# Patient Record
Sex: Male | Born: 1985 | Race: White | Hispanic: No | Marital: Single | Smoking: Former smoker
Health system: Southern US, Community
[De-identification: ages and names within clinical notes are randomized; demographics above are authoritative.]

## PROBLEM LIST (undated history)

## (undated) DIAGNOSIS — I4891 Unspecified atrial fibrillation: Secondary | ICD-10-CM

## (undated) HISTORY — PX: HIP SURGERY: SHX245

---
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
## 2003-01-01 ENCOUNTER — Encounter: Payer: Self-pay | Admitting: Emergency Medicine

## 2003-08-26 ENCOUNTER — Emergency Department (HOSPITAL_COMMUNITY): Admission: EM | Admit: 2003-08-26 | Discharge: 2003-08-26 | Payer: Self-pay | Admitting: Emergency Medicine

## 2004-03-16 ENCOUNTER — Emergency Department (HOSPITAL_COMMUNITY): Admission: EM | Admit: 2004-03-16 | Discharge: 2004-03-16 | Payer: Self-pay | Admitting: Emergency Medicine

## 2004-03-29 ENCOUNTER — Ambulatory Visit: Payer: Self-pay | Admitting: Gastroenterology

## 2004-06-27 ENCOUNTER — Ambulatory Visit: Payer: Self-pay | Admitting: Internal Medicine

## 2004-08-09 ENCOUNTER — Ambulatory Visit: Payer: Self-pay | Admitting: Internal Medicine

## 2004-08-10 ENCOUNTER — Emergency Department (HOSPITAL_COMMUNITY): Admission: EM | Admit: 2004-08-10 | Discharge: 2004-08-10 | Payer: Self-pay | Admitting: Family Medicine

## 2006-12-29 ENCOUNTER — Encounter (INDEPENDENT_AMBULATORY_CARE_PROVIDER_SITE_OTHER): Payer: Self-pay | Admitting: Emergency Medicine

## 2006-12-29 ENCOUNTER — Ambulatory Visit: Admission: RE | Admit: 2006-12-29 | Discharge: 2006-12-29 | Payer: Self-pay | Admitting: Emergency Medicine

## 2008-11-02 ENCOUNTER — Encounter: Admission: RE | Admit: 2008-11-02 | Discharge: 2008-11-02 | Payer: Self-pay | Admitting: Specialist

## 2011-10-20 ENCOUNTER — Ambulatory Visit (INDEPENDENT_AMBULATORY_CARE_PROVIDER_SITE_OTHER): Payer: BC Managed Care – PPO | Admitting: Family Medicine

## 2011-10-20 VITALS — BP 130/86 | HR 84 | Temp 98.1°F | Resp 18 | Ht 70.5 in | Wt 250.0 lb

## 2011-10-20 DIAGNOSIS — R21 Rash and other nonspecific skin eruption: Secondary | ICD-10-CM

## 2011-10-20 MED ORDER — IVERMECTIN 3 MG PO TABS: 3.0000 mg | ORAL_TABLET | Freq: Once | ORAL | Status: DC

## 2011-10-20 NOTE — Progress Notes (Signed)
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  He woke up with red itchy area under left axilla.  Has had exterminator come twice.  No sign of fleas or bedbugs.  Dog sleeps in bed with him.  Girlfriend also is getting this rash.   Last episode before today was last week.  O:  NAD Bright red streak in left axilla  A:  Appears to have some type of infestation.  I cannot identify the source, but this is not typical for bedbug or scabies infestation  P:  Ivermectin 3 mg #4 x 1

## 2012-04-05 ENCOUNTER — Ambulatory Visit (INDEPENDENT_AMBULATORY_CARE_PROVIDER_SITE_OTHER): Payer: BC Managed Care – PPO | Admitting: Family Medicine

## 2012-04-05 VITALS — BP 122/90 | HR 72 | Temp 98.0°F | Resp 16 | Ht 69.0 in | Wt 244.0 lb

## 2012-04-05 DIAGNOSIS — B349 Viral infection, unspecified: Secondary | ICD-10-CM

## 2012-04-05 DIAGNOSIS — B9789 Other viral agents as the cause of diseases classified elsewhere: Secondary | ICD-10-CM

## 2012-04-05 LAB — POCT INFLUENZA A/B
Influenza A, POC: NEGATIVE
Influenza B, POC: NEGATIVE

## 2012-04-05 MED ORDER — HYDROCODONE-HOMATROPINE 5-1.5 MG/5ML PO SYRP: 5.0000 mL | ORAL_SOLUTION | Freq: Three times a day (TID) | ORAL | Status: DC | PRN

## 2012-04-05 NOTE — Progress Notes (Signed)
  Subjective:    Patient ID: Jonathan Russell, male    DOB: 10/19/1985, 26 y.o.   MRN: [id number]  HPI  He got sick a day after his girlfriend.  Has subj f/c at night, pharyngitis initially resolved, burning in chest and coughing but can't get anything up though taking mucinex.  Is pushing fluid but able to drink much - feelng nausea initially but now resolved, no vomiting, normal bowels and nml urine.   Back aches.  History reviewed. No pertinent past medical history.  Review of Systems  Constitutional: Positive for fever, chills, diaphoresis, activity change, appetite change and fatigue. Negative for unexpected weight change.  HENT: Positive for congestion, sore throat and neck pain. Negative for ear pain, sneezing, trouble swallowing, neck stiffness, voice change and sinus pressure.   Eyes: Negative for discharge.  Respiratory: Positive for cough. Negative for chest tightness and shortness of breath.   Cardiovascular: Positive for chest pain.  Gastrointestinal: Positive for nausea. Negative for vomiting, abdominal pain, diarrhea and constipation.  Genitourinary: Negative for dysuria, frequency and decreased urine volume.  Musculoskeletal: Positive for myalgias, back pain and arthralgias. Negative for joint swelling and gait problem.  Skin: Negative for rash.  Neurological: Positive for headaches. Negative for syncope.  Hematological: Negative for adenopathy.  Psychiatric/Behavioral: Positive for sleep disturbance.      BP 122/90  Pulse 72  Temp 98 F (36.7 C) (Oral)  Resp 16  Ht 5\' 9"  (1.753 m)  Wt 244 lb (110.678 kg)  BMI 36.03 kg/m2  SpO2 99% Objective:   Physical Exam  Constitutional: He is oriented to person, place, and time. He appears well-developed and well-nourished. No distress.  HENT:  Head: Normocephalic and atraumatic.  Right Ear: Tympanic membrane, external ear and ear canal normal.  Left Ear: Tympanic membrane, external ear and ear canal normal.  Nose:  Rhinorrhea present. No mucosal edema.  Mouth/Throat: Oropharynx is clear and moist and mucous membranes are normal. No oropharyngeal exudate.  Eyes: Conjunctivae normal are normal. No scleral icterus.  Neck: Normal range of motion. Neck supple. No thyromegaly present.  Cardiovascular: Normal rate, regular rhythm, normal heart sounds and intact distal pulses.   Pulmonary/Chest: Effort normal and breath sounds normal. No respiratory distress.  Musculoskeletal: He exhibits no edema.  Lymphadenopathy:       Head (right side): No submental, no submandibular, no preauricular and no posterior auricular adenopathy present.       Head (left side): No submental, no submandibular, no preauricular and no posterior auricular adenopathy present.    He has cervical adenopathy.       Right cervical: Superficial cervical adenopathy present.       Left cervical: Superficial cervical adenopathy present.       Right: No supraclavicular adenopathy present.       Left: No supraclavicular adenopathy present.  Neurological: He is alert and oriented to person, place, and time.  Skin: Skin is warm and dry. He is not diaphoretic. No erythema.  Psychiatric: He has a normal mood and affect. His behavior is normal.      Results for orders placed in visit on 04/05/12  POCT INFLUENZA A/B      Component Value Range   Influenza A, POC Negative     Influenza B, POC Negative      Assessment & Plan:   1. Viral syndrome  HYDROcodone-homatropine (HYCODAN) 5-1.5 MG/5ML syrup, POCT Influenza A/B  Rest, push fluids, symptomatic treatment for bronchitis.

## 2012-04-07 ENCOUNTER — Telehealth: Payer: Self-pay

## 2012-04-07 NOTE — Telephone Encounter (Signed)
Diagnosis is virus, antibiotic will not help this. Does he need work note? I have called him to advise. He is taking mucinex ,I told him to continue this. He is advised also to try sudafed, to see if this is helpful. He states he already has work note. FYI only

## 2012-04-07 NOTE — Telephone Encounter (Signed)
[facility]ANT[facility] TO CALL HIM IN AN ANTIBIOTIC.  SAYS IT IS NOT GETTING ANY BETTER AT ALL.  DOES NOT HAVE THE MONEY FOR ANOTHER OFFICE VISIT.  SAYS HE HAS MISSED WORK ALL WEEK.  HE USES [facility] ON [address].  PLEASE CALL HIM ASAP AND LET HIM KNOW IF WE CAN CALL IN SOMETHING.  [phone]

## 2012-04-09 NOTE — Telephone Encounter (Signed)
Called pt. He is feeling better. Just a lingering cough but cough syrup helping and he doesn't need refill.  His girlfriend is also better.

## 2012-04-13 ENCOUNTER — Ambulatory Visit (INDEPENDENT_AMBULATORY_CARE_PROVIDER_SITE_OTHER): Payer: BC Managed Care – PPO | Admitting: Family Medicine

## 2012-04-13 ENCOUNTER — Ambulatory Visit: Payer: BC Managed Care – PPO

## 2012-04-13 VITALS — BP 124/79 | HR 86 | Temp 98.3°F | Resp 16 | Ht 69.0 in | Wt 247.8 lb

## 2012-04-13 DIAGNOSIS — S61209A Unspecified open wound of unspecified finger without damage to nail, initial encounter: Secondary | ICD-10-CM

## 2012-04-13 DIAGNOSIS — Z23 Encounter for immunization: Secondary | ICD-10-CM

## 2012-04-13 DIAGNOSIS — M79609 Pain in unspecified limb: Secondary | ICD-10-CM

## 2012-04-13 DIAGNOSIS — M79645 Pain in left finger(s): Secondary | ICD-10-CM

## 2012-04-13 DIAGNOSIS — S62639B Displaced fracture of distal phalanx of unspecified finger, initial encounter for open fracture: Secondary | ICD-10-CM

## 2012-04-13 DIAGNOSIS — S6710XA Crushing injury of unspecified finger(s), initial encounter: Secondary | ICD-10-CM

## 2012-04-13 DIAGNOSIS — S62637B Displaced fracture of distal phalanx of left little finger, initial encounter for open fracture: Secondary | ICD-10-CM

## 2012-04-13 MED ORDER — KETOROLAC TROMETHAMINE 60 MG/2ML IM SOLN
60.0000 mg | Freq: Once | INTRAMUSCULAR | Status: AC
  Administered 2012-04-13: 60 mg via INTRAMUSCULAR

## 2012-04-13 MED ORDER — TRAMADOL HCL 50 MG PO TABS: 50.0000 mg | ORAL_TABLET | Freq: Three times a day (TID) | ORAL | Status: DC | PRN

## 2012-04-13 MED ORDER — TETANUS-DIPHTH-ACELL PERTUSSIS 5-2.5-18.5 LF-MCG/0.5 IM SUSP
0.5000 mL | Freq: Once | INTRAMUSCULAR | Status: AC
  Administered 2012-04-13: 0.5 mL via INTRAMUSCULAR

## 2012-04-13 NOTE — Progress Notes (Signed)
26 year old gentleman who crushed his left pinky finger while working on his car this morning. He crushed the distal phalanx and partially avulsed the nail. Finger is quite painful and has been oozing blood. His last technical shot was in 2006.  Objective: Patient alert and cooperative holding his left hand still Good sensation and ROM demonstrated by patient before anesth.  Digital block with 2 cc marcaine 0.5% each side of proximal phalanx  [facility] reading (PRIMARY) by  Dr. Milus Glazier:  Comminuted distal phalanx L pinky fx sparing the joint.  The distal phalanx of the pinky has been lacerated from the middle distal nail across the top of the phalanx and at the ulnar base of the cuticle where the proximal nail appears to have been avulsed.  The bleeding has subsided.  Assessment:    1. Finger pain, left  DG Finger Little Left  2. Wound, open, finger  DG Finger Little Left, TDaP (BOOSTRIX) injection 0.5 mL

## 2012-04-13 NOTE — Patient Instructions (Addendum)
Keep area clean and dry.  Recheck in 48 hrs for drsg removal and recheck.  Take pain meds as prescribed.  Call if you need a stronger pain med.  We will probably take out sutures in 10d.  WOUND CARE Please return in 2 days to have your wound checked.  The stitches/staples will be removed in about 10d. Marland Kitchen Keep area clean and dry for 24 hours. Do not remove bandage, if applied. . After 24 hours, remove bandage and wash wound gently with mild soap and warm water. Reapply a new bandage after cleaning wound, if directed. . Continue daily cleansing with soap and water until stitches/staples are removed. . Do not apply any ointments or creams to the wound while stitches/staples are in place, as this may cause delayed healing. . Notify the office if you experience any of the following signs of infection: Swelling, redness, pus drainage, streaking, fever >101.0 F . Notify the office if you experience excessive bleeding that does not stop after 15-20 minutes of constant, firm pressure.

## 2012-04-13 NOTE — Progress Notes (Signed)
Procedure:  Consent obtained from patient.  MC block with 1% lido and marcaine (50:50) with successful anesthesia.  Wound explored and cleaned.  Fingertip laceration repair with 5-0 ethilon #11 SI sutures.  Once finger tip was stable, avulsed nail was removed.  Nail bed was 100% lacerated about at distal 1/3 of nail.  Bone felt and visualized because part of nail bed was on removed nail.  5-0 Vicryl was used # 3 SI sutures.  Nail placed back on nail bed for splint.  Steri-strip placed around nail to hold it in place.  Non stick drsg and fold over splint was placed.  Wound care D/w pt.  Pt to recheck in 48h, do not removed the drsg.  D/w pt he is able to take hydrocodone without nausea, he gets a little light headed.

## 2012-04-15 ENCOUNTER — Ambulatory Visit (INDEPENDENT_AMBULATORY_CARE_PROVIDER_SITE_OTHER): Payer: BC Managed Care – PPO | Admitting: Family Medicine

## 2012-04-15 ENCOUNTER — Encounter: Payer: Self-pay | Admitting: Family Medicine

## 2012-04-15 VITALS — BP 134/85 | HR 81 | Temp 97.8°F | Resp 16 | Ht 69.0 in | Wt 249.0 lb

## 2012-04-15 DIAGNOSIS — S61209A Unspecified open wound of unspecified finger without damage to nail, initial encounter: Secondary | ICD-10-CM

## 2012-04-15 DIAGNOSIS — M79609 Pain in unspecified limb: Secondary | ICD-10-CM

## 2012-04-15 DIAGNOSIS — S62639A Displaced fracture of distal phalanx of unspecified finger, initial encounter for closed fracture: Secondary | ICD-10-CM

## 2012-04-15 NOTE — Progress Notes (Signed)
Is a 26 year old man who comes in with painful left pinky after having had a crush injury on Tuesday, December 24. His work on his car when the finger was crushed. He was diagnosed with a fracture laceration. Finger was repaired by removing the nail and then replacing it over the matrix uses a splint. In addition he receives and stitches to close the distal phalanx laceration.  Since the left, patient has had uncontrolled pain. It did seem to get better when he took off the splint because the pressure was so unbearable. He's had no fever or discharge from the dressing.  Patient is out of work until Monday  Objective: We remove the tube dressing and Coban revealing a Steri-Strip dressing. The finger was not sufficiently swollen and there was no redness under the bandage that  Remained.  Assessment: No sign of acute infection wound is dry and clean sew the inner bandage was not removed  Plan:  Recheck in 3 days.  Continue monitoring of situation for 3 weeks

## 2012-04-18 ENCOUNTER — Ambulatory Visit (INDEPENDENT_AMBULATORY_CARE_PROVIDER_SITE_OTHER): Payer: BC Managed Care – PPO | Admitting: Physician Assistant

## 2012-04-18 VITALS — BP 131/84 | HR 94 | Temp 98.4°F | Resp 16 | Ht 69.0 in | Wt 252.0 lb

## 2012-04-18 DIAGNOSIS — M79646 Pain in unspecified finger(s): Secondary | ICD-10-CM

## 2012-04-18 DIAGNOSIS — S61209A Unspecified open wound of unspecified finger without damage to nail, initial encounter: Secondary | ICD-10-CM

## 2012-04-18 DIAGNOSIS — M79609 Pain in unspecified limb: Secondary | ICD-10-CM
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
## 2012-04-21 ENCOUNTER — Ambulatory Visit (INDEPENDENT_AMBULATORY_CARE_PROVIDER_SITE_OTHER): Payer: BC Managed Care – PPO | Admitting: Physician Assistant

## 2012-04-21 VITALS — BP 126/78 | HR 80 | Temp 97.9°F | Resp 18 | Ht 70.0 in | Wt 253.6 lb

## 2012-04-21 DIAGNOSIS — M79609 Pain in unspecified limb: Secondary | ICD-10-CM

## 2012-04-21 DIAGNOSIS — M79646 Pain in unspecified finger(s): Secondary | ICD-10-CM

## 2012-04-21 DIAGNOSIS — S61209A Unspecified open wound of unspecified finger without damage to nail, initial encounter: Secondary | ICD-10-CM

## 2012-04-21 NOTE — Progress Notes (Signed)
  Subjective:    Patient ID: Jonathan Russell, male    DOB: 05/20/1985, 27 y.o.   MRN: [id number]  HPI   Jonathan Russell is a very pleasant 27 yr old male here for wound check/suture removal.  Previous note reviewed, he was seen here 3 days ago for recheck.  Patient is doing well. Wound is healing nicely.  No drainage, redness, swelling, fever, chills.      Review of Systems  All other systems reviewed and are negative.       Objective:   Physical Exam  Vitals reviewed. Constitutional: He is oriented to person, place, and time. He appears well-developed and well-nourished. No distress.  HENT:  Head: Normocephalic and atraumatic.  Musculoskeletal:       Healing wound on distal portion of left 5th digit; steristrips in place; no swelling, erythema, drainage; TTP  Neurological: He is alert and oriented to person, place, and time.  Skin: Skin is warm and dry.  Psychiatric: He has a normal mood and affect. His behavior is normal.          Assessment & Plan:   1. Wound, open, finger   2. Pain in finger     Jonathan Russell is a very pleasant 27 yr old male here for recheck/suture removal.  Wound is healing very well.  I removed 5 sutures today.  Several sutures are covered by the steristrips that are holding the nail in place.  Will have him return in three days to remove steristrips and remaining sutures.  Fast track card given.

## 2012-04-24 ENCOUNTER — Ambulatory Visit (INDEPENDENT_AMBULATORY_CARE_PROVIDER_SITE_OTHER): Payer: BC Managed Care – PPO | Admitting: Physician Assistant

## 2012-04-24 VITALS — BP 144/84 | HR 97 | Temp 97.4°F | Resp 16 | Ht 69.0 in | Wt 255.0 lb

## 2012-04-24 DIAGNOSIS — S61209A Unspecified open wound of unspecified finger without damage to nail, initial encounter: Secondary | ICD-10-CM

## 2012-04-24 NOTE — Progress Notes (Signed)
  Subjective:    Patient ID: Jonathan Russell, male    DOB: 1986-04-18, 27 y.o.   MRN: [id number]  HPI 27 year old male presents for recheck of left pinky wound.  Had #5 suture removed 2 days ago, and was told to come back today for the remainder to be removed.  Steri strips still in place that are holding nail down. No erythema, warmth, or drainage. Patient doing well without complaint.      Review of Systems  Constitutional: Negative for fever and chills.  Gastrointestinal: Negative for nausea and vomiting.  Skin: Positive for wound.       Objective:   Physical Exam  Constitutional: He is oriented to person, place, and time. He appears well-developed and well-nourished.  HENT:  Head: Normocephalic and atraumatic.  Right Ear: External ear normal.  Eyes: Conjunctivae normal are normal.  Neck: Normal range of motion.  Cardiovascular: Normal rate.   Pulmonary/Chest: Effort normal.  Neurological: He is alert and oriented to person, place, and time.  Skin:       Left pinky wound well healing without erythema, warmth, or tenderness. Scab in place over sutures.    Psychiatric: He has a normal mood and affect. His behavior is normal. Judgment and thought content normal.          Assessment & Plan:    1. Wound, open, finger   #6 suture removed without difficulty.  Recommend daily warm soapy soaks to help dissolve scab Follow up as needed.

## 2012-07-01 ENCOUNTER — Ambulatory Visit (INDEPENDENT_AMBULATORY_CARE_PROVIDER_SITE_OTHER): Payer: BC Managed Care – PPO | Admitting: Emergency Medicine

## 2012-07-01 VITALS — BP 122/80 | HR 77 | Temp 98.1°F | Resp 18 | Ht 69.0 in | Wt 256.0 lb

## 2012-07-01 DIAGNOSIS — S058X9A Other injuries of unspecified eye and orbit, initial encounter: Secondary | ICD-10-CM

## 2012-07-01 DIAGNOSIS — S0502XA Injury of conjunctiva and corneal abrasion without foreign body, left eye, initial encounter: Secondary | ICD-10-CM

## 2012-07-01 MED ORDER — HYDROCODONE-ACETAMINOPHEN 5-325 MG PO TABS: 1.0000 | ORAL_TABLET | ORAL | Status: DC | PRN

## 2012-07-01 MED ORDER — OFLOXACIN 0.3 % OP SOLN: 1.0000 [drp] | OPHTHALMIC | Status: DC

## 2012-07-01 NOTE — Patient Instructions (Addendum)
Corneal Abrasion  The cornea is the clear covering at the front and center of the eye. When looking at the colored portion (iris) of the eye, you are looking through that person's cornea.   This very thin tissue is made up of many layers. The surface layer is a single layer of cells called the corneal epithelium. This is one of the most sensitive tissues in the body. If a scratch or injury causes the corneal epithelium to come off, it is called a corneal abrasion. If the injury extends to the tissues below the epithelium, the condition is called a corneal ulcer.   CAUSES    Scratches.   Trauma.   Foreign body in the eye.   Some people have recurrences of abrasions in the area of the original injury even after they heal. This is called recurrent erosion syndrome. Recurrent erosion syndromes generally improve and go away with time.  SYMPTOMS    Eye pain.   Difficulty or inability to keep the injured eye open.   The eye becomes very sensitive to light.   Recurrent erosions tend to happen suddenly, first thing in the morning  usually upon awakening and opening the eyes.  DIAGNOSIS   Your eye professional can diagnose a corneal abrasion during an eye exam. Dye is usually placed in the eye using a drop or a small paper strip moistened by the patient's tears. When the eye is examined with a special light, the abrasion shows up clearly because of the dye.  TREATMENT    Small abrasions may be treated with antibiotic drops or ointment alone.   Usually a pressure patch is specially applied. Pressure patches prevent the eye from blinking, allowing the corneal epithelium to heal. Because blinking is less, a pressure patch also reduces the amount of pain present in the eye during healing. Most corneal abrasions heal within 2-3 days with no effect on vision. WARNING: Do not drive or operate machinery while your eye is patched. Your ability to judge distances is impaired.   If abrasion becomes infected and spreads to the  deeper tissues of the cornea, a corneal ulcer can result. This is serious because it can cause corneal scarring. Corneal scars interfere with light passing through the cornea, and cause a loss of vision in the involved eye.   If your caregiver has given you a follow-up appointment, it is very important to keep that appointment. Not keeping the appointment could result in a severe eye infection or permanent loss of vision. If there is any problem keeping the appointment, you must call back to this facility for assistance.  SEEK MEDICAL CARE IF:    You have pain, light sensitivity and a scratchy feeling in one eye (or both).   Your pressure patch keeps loosening up and you can blink your eye under the patch after treatment.   Any kind of discharge develops from the involved eye after treatment or if the lids stick together in the morning.   You have the same symptoms in the morning as you did with the original abrasion days, weeks or months after the abrasion healed.  MAKE SURE YOU:    Understand these instructions.   Will watch your condition.   Will get help right away if you are not doing well or get worse.  Document Released: 04/04/2000 Document Revised: 06/30/2011 Document Reviewed: 11/11/2007  [employer] Patient Information 2013 [employer].
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
## 2012-09-20 ENCOUNTER — Ambulatory Visit (INDEPENDENT_AMBULATORY_CARE_PROVIDER_SITE_OTHER): Payer: BC Managed Care – PPO | Admitting: Internal Medicine

## 2012-09-20 VITALS — BP 131/79 | HR 73 | Temp 97.9°F | Resp 16 | Ht 69.75 in | Wt 258.8 lb

## 2012-09-20 DIAGNOSIS — H5710 Ocular pain, unspecified eye: Secondary | ICD-10-CM

## 2012-09-20 DIAGNOSIS — Z6837 Body mass index (BMI) 37.0-37.9, adult: Secondary | ICD-10-CM | POA: Insufficient documentation

## 2012-09-20 DIAGNOSIS — T1500XA Foreign body in cornea, unspecified eye, initial encounter: Secondary | ICD-10-CM

## 2012-09-20 DIAGNOSIS — H5711 Ocular pain, right eye: Secondary | ICD-10-CM

## 2012-09-20 DIAGNOSIS — T1501XA Foreign body in cornea, right eye, initial encounter: Secondary | ICD-10-CM

## 2012-09-20 DIAGNOSIS — F17200 Nicotine dependence, unspecified, uncomplicated: Secondary | ICD-10-CM

## 2012-09-20 MED ORDER — GENTAMICIN SULFATE 0.3 % OP SOLN: OPHTHALMIC | Status: DC

## 2012-09-20 NOTE — Progress Notes (Signed)
  Subjective:    Patient ID: Jonathan Russell, male    DOB: 1986/02/14, 27 y.o.   MRN: [id number]  HPI  first office visit complaining of pain for 24 hours in the right eye Photophobia, discharge, redness, foreign body sensation Started after mowing grass yesterday No vision changes  Patient Active Problem List   Diagnosis Date Noted  . Nicotine addiction 09/20/2012  . BMI 37.0-37.9, adult 09/20/2012     Review of Systems     Objective:   Physical Exam BP 131/79  Pulse 73  Temp(Src) 97.9 F (36.6 C) (Oral)  Resp 16  Ht 5' 9.75" (1.772 m)  Wt 258 lb 12.8 oz (117.391 kg)  BMI 37.39 kg/m2  SpO2 99% Right eye has conjunctival injection and inability to maintain open lids No lid swelling or redness Ophthaine plus fluorescein allowed observation of a defect at 1:00 at the margin of the cornea Defect caused by foreign body which was no longer present after swabbing and irrigation Lids everted and swabbed with no other foreign bodies discovered  Pupils equal reactive to light and accommodation/EOMs conjugate       Assessment & Plan:  Problem #1 pain R eye Problem #2 corneal foreign body   OOW today Meds ordered this encounter  Medications  . gentamicin (GARAMYCIN) 0.3 % ophthalmic solution    Sig: 2 drops R eye q 2 hr today then every 4 hr for 2 more days    Dispense:  5 mL    Refill:  0   Followup in 24 hours to 48 hours if not responding  Needs followup for primary care for noted problems

## 2012-10-05 ENCOUNTER — Ambulatory Visit (INDEPENDENT_AMBULATORY_CARE_PROVIDER_SITE_OTHER): Payer: BC Managed Care – PPO | Admitting: Internal Medicine

## 2012-10-05 VITALS — BP 128/78 | HR 94 | Temp 98.1°F | Resp 18 | Ht 69.0 in | Wt 254.0 lb

## 2012-10-05 DIAGNOSIS — R21 Rash and other nonspecific skin eruption: Secondary | ICD-10-CM

## 2012-10-05 DIAGNOSIS — L0291 Cutaneous abscess, unspecified: Secondary | ICD-10-CM

## 2012-10-05 DIAGNOSIS — D72829 Elevated white blood cell count, unspecified: Secondary | ICD-10-CM

## 2012-10-05 DIAGNOSIS — L0390 Cellulitis, unspecified: Secondary | ICD-10-CM

## 2012-10-05 DIAGNOSIS — R1110 Vomiting, unspecified: Secondary | ICD-10-CM

## 2012-10-05 DIAGNOSIS — L299 Pruritus, unspecified: Secondary | ICD-10-CM

## 2012-10-05 LAB — POCT CBC
Granulocyte percent: 78.1 %G (ref 37–80)
HCT, POC: 49 % (ref 43.5–53.7)
Hemoglobin: 15.9 g/dL (ref 14.1–18.1)
Lymph, poc: 2.2 (ref 0.6–3.4)
MCHC: 32.4 g/dL (ref 31.8–35.4)
MCV: 94.2 fL (ref 80–97)
MID (cbc): 0.9 (ref 0–0.9)
POC Granulocyte: 10.9 — AB (ref 2–6.9)
Platelet Count, POC: 232 10*3/uL (ref 142–424)
RBC: 5.2 M/uL (ref 4.69–6.13)
RDW, POC: 13.1 %
WBC: 13.9 10*3/uL — AB (ref 4.6–10.2)

## 2012-10-05 MED ORDER — CEFTRIAXONE SODIUM 1 G IJ SOLR
1.0000 g | Freq: Once | INTRAMUSCULAR | Status: AC
  Administered 2012-10-05: 1 g via INTRAMUSCULAR

## 2012-10-05 MED ORDER — DOXYCYCLINE HYCLATE 100 MG PO TABS: 100.0000 mg | ORAL_TABLET | Freq: Two times a day (BID) | ORAL | Status: DC

## 2012-10-05 MED ORDER — MUPIROCIN 2 % EX OINT: TOPICAL_OINTMENT | Freq: Three times a day (TID) | CUTANEOUS | Status: DC

## 2012-10-05 NOTE — Patient Instructions (Addendum)
Cellulitis Cellulitis is an infection of the skin and the tissue beneath it. The infected area is usually red and tender. Cellulitis occurs most often in the arms and lower legs.  CAUSES  Cellulitis is caused by bacteria that enter the skin through cracks or cuts in the skin. The most common types of bacteria that cause cellulitis are Staphylococcus and Streptococcus. SYMPTOMS   Redness and warmth.  Swelling.  Tenderness or pain.  Fever. DIAGNOSIS  Your caregiver can usually determine what is wrong based on a physical exam. Blood tests may also be done. TREATMENT  Treatment usually involves taking an antibiotic medicine. HOME CARE INSTRUCTIONS   Take your antibiotics as directed. Finish them even if you start to feel better.  Keep the infected arm or leg elevated to reduce swelling.  Apply a warm cloth to the affected area up to 4 times per day to relieve pain.  Only take over-the-counter or prescription medicines for pain, discomfort, or fever as directed by your caregiver.  Keep all follow-up appointments as directed by your caregiver. SEEK MEDICAL CARE IF:   You notice red streaks coming from the infected area.  Your red area gets larger or turns dark in color.  Your bone or joint underneath the infected area becomes painful after the skin has healed.  Your infection returns in the same area or another area.  You notice a swollen bump in the infected area.  You develop new symptoms. SEEK IMMEDIATE MEDICAL CARE IF:   You have a fever.  You feel very sleepy.  You develop vomiting or diarrhea.  You have a general ill feeling (malaise) with muscle aches and pains. MAKE SURE YOU:   Understand these instructions.  Will watch your condition.  Will get help right away if you are not doing well or get worse. Document Released: 01/15/2005 Document Revised: 10/07/2011 Document Reviewed: 06/23/2011 [facility] Patient Information 2014 [place], [place]. Spider  Bite Spider bites are not common. Most spider bites do not cause serious problems. The elderly, very young children, and people with certain existing medical conditions are more likely to experience significant symptoms. SYMPTOMS  Spider bites may not cause any pain at first. Within 1 or 2 days of the bite, there may be swelling, redness, and pain in the bite area. However, some spider bites can cause pain within the first hour. TREATMENT  Your caregiver may prescribe antibiotic medicine if a bacterial infection develops in the bite. However, not all spider bites require antibiotics or prescription medicines.  HOME CARE INSTRUCTIONS  Do not scratch the bite area.  Keep the bite area clean and dry. Wash the area with soap and water as directed.  Put ice or cool compresses on the bite area.  Put ice in a plastic bag.  Place a towel between your skin and the bag.  Leave the ice on for 20 minutes, 4 times a day for the first 2 to 3 days, or as directed.  Keep the bite area elevated above the level of your heart. This helps reduce redness and swelling.  Only take over-the-counter or prescription medicines as directed by your caregiver.  If you are given antibiotics, take them as directed. Finish them even if you start to feel better. You may need a tetanus shot if:  You cannot remember when you had your last tetanus shot.  You have never had a tetanus shot.  The injury broke your skin. If you get a tetanus shot, your arm may swell, get red,  and feel warm to the touch. This is common and not a problem. If you need a tetanus shot and you choose not to have one, there is a rare chance of getting tetanus. Sickness from tetanus can be serious. SEEK MEDICAL CARE IF: Your bite is not better after 3 days of treatment. SEEK IMMEDIATE MEDICAL CARE IF:  Your bite turns purple or develops increased swelling, pain, or redness.  You develop shortness of breath or chest pain.  You have muscle  cramps or painful muscle spasms.  You develop abdominal pain, nausea, or vomiting.  You feel unusually tired or sleepy. MAKE SURE YOU:  Understand these instructions.  Will watch your condition.  Will get help right away if you are not doing well or get worse. Document Released: 05/15/2004 Document Revised: 06/30/2011 Document Reviewed: 11/06/2010 [facility] Patient Information 2014 [place], [place].

## 2012-10-05 NOTE — Progress Notes (Signed)
  Subjective:    Patient ID: Jonathan Russell, male    DOB: 1985/09/07, 27 y.o.   MRN: [id number]  HPI  27 year old male c/o: Cellulitis abdomen X 2 days, two separate sites, burning, itching,  vomitted once this morning, unknown if insect bite. Awoke in the morning with wounds on abdomen.  Not exposed to poison ivy.  No fever, no abdom pain. Rash on chest and abdomen. Did not see or feel a bite. 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 Review of Systems Healthy     Objective:   Physical Exam  Constitutional: He is oriented to person, place, and time. He appears well-developed and well-nourished. No distress.  Eyes: EOM are normal.  Neck: Neck supple.  Cardiovascular: Normal rate, regular rhythm and normal heart sounds.   Pulmonary/Chest: Effort normal and breath sounds normal.  Abdominal: Soft. Bowel sounds are normal. He exhibits no distension and no mass. There is no tenderness. There is no rebound.  Musculoskeletal: Normal range of motion.  Neurological: He is alert and oriented to person, place, and time. He exhibits normal muscle tone. Coordination normal.  Skin: Ecchymosis, lesion, petechiae and rash noted. Rash is maculopapular. There is erythema.     Maculopapular erythematous plaque with excoriations versus open blister  Psychiatric: He has a normal mood and affect.   Results for orders placed in visit on 10/05/12  POCT CBC      Result Value Range   WBC 13.9 (*) 4.6 - 10.2 K/uL   Lymph, poc 2.2  0.6 - 3.4   POC LYMPH PERCENT 15.6  10 - 50 %L   MID (cbc) 0.9  0 - 0.9   POC MID % 6.3  0 - 12 %M   POC Granulocyte 10.9 (*) 2 - 6.9   Granulocyte percent 78.1  37 - 80 %G   RBC 5.20  4.69 - 6.13 M/uL   Hemoglobin 15.9  14.1 - 18.1 g/dL   HCT, POC 40.9  81.1 - 53.7 %   MCV 94.2  80 - 97 fL   MCH, POC 30.6  27 - 31.2 pg   MCHC 32.4  31.8 - 35.4 g/dL   RDW, POC 91.4     Platelet Count, POC 232  142 - 424 K/uL   MPV 12.4  0 - 99.8 fL          Assessment & Plan:   Rocphen 1g/Mupirocin after soap and water to wounds Doxycycline 100mg  bid Reck 1-2 days

## 2012-10-07 ENCOUNTER — Ambulatory Visit (INDEPENDENT_AMBULATORY_CARE_PROVIDER_SITE_OTHER): Payer: BC Managed Care – PPO | Admitting: Physician Assistant

## 2012-10-07 VITALS — BP 133/82 | HR 87 | Temp 97.9°F | Resp 16 | Ht 67.0 in | Wt 254.0 lb

## 2012-10-07 DIAGNOSIS — S31109S Unspecified open wound of abdominal wall, unspecified quadrant without penetration into peritoneal cavity, sequela: Secondary | ICD-10-CM

## 2012-10-07 DIAGNOSIS — IMO0002 Reserved for concepts with insufficient information to code with codable children: Secondary | ICD-10-CM

## 2012-10-07 LAB — POCT CBC
Granulocyte percent: 61.3 %G (ref 37–80)
HCT, POC: 49 % (ref 43.5–53.7)
Hemoglobin: 15.8 g/dL (ref 14.1–18.1)
Lymph, poc: 2.4 (ref 0.6–3.4)
MCH, POC: 30.5 pg (ref 27–31.2)
MCHC: 32.2 g/dL (ref 31.8–35.4)
MCV: 94.5 fL (ref 80–97)
MID (cbc): 0.6 (ref 0–0.9)
MPV: 11.6 fL (ref 0–99.8)
POC Granulocyte: 4.8 (ref 2–6.9)
POC LYMPH PERCENT: 30.8 %L (ref 10–50)
POC MID %: 7.9 %M (ref 0–12)
Platelet Count, POC: 233 10*3/uL (ref 142–424)
RBC: 5.18 M/uL (ref 4.69–6.13)
RDW, POC: 12.7 %
WBC: 7.8 10*3/uL (ref 4.6–10.2)

## 2012-10-07 NOTE — Progress Notes (Signed)
  Subjective:    Patient ID: Jonathan Russell, male    DOB: 09-07-1985, 27 y.o.   MRN: [id number]  HPI 27 year old male presents for recheck of wound on his abdomen.  Was here on 10/05/12 and was told he possibly had a spider bite but is also being treated for possible skin/staph infection.  Given Rocephin 1 gram, started on doxycycline 100 mg bid and also using mupirocin ointment to his wounds. Has 2 - one on left lower abdomen and a smaller one on right upper abdomen.  He states both are doing much better today and the redness has decreased.  Has some nausea associated with the doxycycline but is tolerating it ok.  No fever, chills, abdominal pain, emesis, or headache.      Review of Systems  Constitutional: Negative for fever and chills.  Gastrointestinal: Positive for nausea. Negative for vomiting.  Skin: Positive for color change and rash.  Neurological: Negative for headaches.       Objective:   Physical Exam  Constitutional: He is oriented to person, place, and time. He appears well-developed and well-nourished.  HENT:  Head: Normocephalic and atraumatic.  Right Ear: External ear normal.  Left Ear: External ear normal.  Eyes: Conjunctivae are normal.  Neck: Normal range of motion.  Cardiovascular: Normal rate.   Pulmonary/Chest: Effort normal.  Neurological: He is alert and oriented to person, place, and time.  Skin:     Psychiatric: He has a normal mood and affect. His behavior is normal. Judgment and thought content normal.    Wound culture pending  Results for orders placed in visit on 10/07/12  POCT CBC      Result Value Range   WBC 7.8  4.6 - 10.2 K/uL   Lymph, poc 2.4  0.6 - 3.4   POC LYMPH PERCENT 30.8  10 - 50 %L   MID (cbc) 0.6  0 - 0.9   POC MID % 7.9  0 - 12 %M   POC Granulocyte 4.8  2 - 6.9   Granulocyte percent 61.3  37 - 80 %G   RBC 5.18  4.69 - 6.13 M/uL   Hemoglobin 15.8  14.1 - 18.1 g/dL   HCT, POC 40.9  81.1 - 53.7 %   MCV 94.5  80 - 97 fL   MCH, POC 30.5  27 - 31.2 pg   MCHC 32.2  31.8 - 35.4 g/dL   RDW, POC 91.4     Platelet Count, POC 233  142 - 424 K/uL   MPV 11.6  0 - 99.8 fL         Assessment & Plan:  Open wound anterior abdominal wall, sequela - Plan: POCT CBC  Patient reports improvement in wound today. White count back within normal range Continue doxycycline twice daily x 10 days Return if symptoms worsen or fail to improve.

## 2012-10-08 LAB — WOUND CULTURE
Gram Stain: NONE SEEN
Gram Stain: NONE SEEN
Gram Stain: NONE SEEN
Organism ID, Bacteria: NO GROWTH

## 2012-12-25 ENCOUNTER — Encounter: Payer: Self-pay | Admitting: Physician Assistant

## 2012-12-25 ENCOUNTER — Emergency Department (HOSPITAL_COMMUNITY): Payer: BC Managed Care – PPO

## 2012-12-25 ENCOUNTER — Emergency Department (HOSPITAL_COMMUNITY)
Admission: EM | Admit: 2012-12-25 | Discharge: 2012-12-25 | Disposition: A | Discharge status: Home / Self Care | Payer: BC Managed Care – PPO | Attending: Emergency Medicine | Admitting: Emergency Medicine

## 2012-12-25 ENCOUNTER — Encounter (HOSPITAL_COMMUNITY): Payer: Self-pay | Admitting: Emergency Medicine

## 2012-12-25 DIAGNOSIS — F17200 Nicotine dependence, unspecified, uncomplicated: Secondary | ICD-10-CM | POA: Insufficient documentation

## 2012-12-25 DIAGNOSIS — Z792 Long term (current) use of antibiotics: Secondary | ICD-10-CM | POA: Insufficient documentation

## 2012-12-25 DIAGNOSIS — Z79899 Other long term (current) drug therapy: Secondary | ICD-10-CM | POA: Insufficient documentation

## 2012-12-25 DIAGNOSIS — I4891 Unspecified atrial fibrillation: Secondary | ICD-10-CM

## 2012-12-25 LAB — RAPID URINE DRUG SCREEN, HOSP PERFORMED
Amphetamines: NOT DETECTED
Opiates: NOT DETECTED
Tetrahydrocannabinol: POSITIVE — AB

## 2012-12-25 LAB — COMPREHENSIVE METABOLIC PANEL
AST: 21 U/L (ref 0–37)
Albumin: 4.1 g/dL (ref 3.5–5.2)
Alkaline Phosphatase: 77 U/L (ref 39–117)
BUN: 13 mg/dL (ref 6–23)
CO2: 23 mEq/L (ref 19–32)
Calcium: 9.5 mg/dL (ref 8.4–10.5)
Chloride: 100 mEq/L (ref 96–112)
GFR calc Af Amer: >90 mL/min (ref >90)
Glucose, Bld: 88 mg/dL (ref 70–99)
Potassium: 3.6 mEq/L (ref 3.5–5.1)
Sodium: 137 mEq/L (ref 135–145)
Total Bilirubin: 0.5 mg/dL (ref 0.3–1.2)
Total Protein: 7.4 g/dL (ref 6.0–8.3)

## 2012-12-25 LAB — D-DIMER, QUANTITATIVE: D-Dimer, Quant: <0.27 ug/mL-FEU (ref 0.00–0.48)

## 2012-12-25 LAB — CBC
HCT: 44.9 % (ref 39.0–52.0)
Hemoglobin: 16.1 g/dL (ref 13.0–17.0)
MCHC: 35.9 g/dL (ref 30.0–36.0)
MCV: 88.9 fL (ref 78.0–100.0)
RBC: 5.05 MIL/uL (ref 4.22–5.81)
RDW: 12.6 % (ref 11.5–15.5)

## 2012-12-25 LAB — TSH: TSH: 4.842 u[IU]/mL — ABNORMAL HIGH (ref 0.350–4.500)

## 2012-12-25 MED ORDER — METOPROLOL TARTRATE 50 MG PO TABS: 25.0000 mg | ORAL_TABLET | Freq: Two times a day (BID) | ORAL | Status: DC

## 2012-12-25 MED ORDER — SODIUM CHLORIDE 0.9 % IV BOLUS (SEPSIS)
1000.0000 mL | Freq: Once | INTRAVENOUS | Status: AC
  Administered 2012-12-25: 1000 mL via INTRAVENOUS

## 2012-12-25 MED ORDER — ASPIRIN 325 MG PO TABS
325.0000 mg | ORAL_TABLET | ORAL | Status: AC
  Administered 2012-12-25: 325 mg via ORAL
  Filled 2012-12-25: qty 1

## 2012-12-25 NOTE — ED Notes (Signed)
EDP Dr. Lavella Lemons at [facility]

## 2012-12-25 NOTE — ED Notes (Signed)
Pt requesting breakfast

## 2012-12-25 NOTE — ED Notes (Signed)
Xray finished at [facility], family at [facility].

## 2012-12-25 NOTE — ED Provider Notes (Signed)
CSN: [id number]     Arrival date & time 12/25/12  0441 History   First MD Initiated Contact with Patient 12/25/12 [phone]     Chief Complaint  Patient presents with  . Palpitations   (Consider location/radiation/quality/duration/timing/severity/associated sxs/prior Treatment) HPI Jonathan Russell is a pleasant 27 yo man who presents with complaints of palpitations and "funny feeling". He awoke from sleep just PTA with sensation of rapid heart beat and palpitations. Denies history of same. He felt somewhat lightheaded at home when sx began. He is currently laying supine on gurney and denies lightheadedness. At no time has he experienced chest pain or discomfort.  states he was told a long time ago that he had an enlarged heart on CXR. Denies SOB. He has had a slight cough productive of yellow-green sputum and sore throat. No fever.   He was seen a couple of days at at a local UC for right sided back pain and prescribed Tramadol & Flexeril. But, he has not taken Flexeril in > 24 hrs because it made him feel "funny".   Patient denies any cocaine or illicit drug use. He drinks alcohol socially. He denies use of any dietary supplements. Notes that he believes his mother was diagnosed with some type of dysrrythmia.    History reviewed. No pertinent past medical history. Past Surgical History  Procedure Laterality Date  . Hip surgery     No family history on file. History  Substance Use Topics  . Smoking status: Current Every Day Smoker -- 1.00 packs/day    Types: Cigarettes  . Smokeless tobacco: Not on file  . Alcohol Use: Yes    Review of Systems Gen: no weight loss, fevers, chills, night sweats Eyes: no discharge or drainage, no occular pain or visual changes Nose: no epistaxis or rhinorrhea Mouth: As per history of present illness, wheezing Neck: no neck pain Lungs: As per history of present illness, otherwise negative CV: As per history of present illness, otherwise negative Abd: no  abdominal pain, nausea, vomiting GU: no dysuria or gross hematuria MSK: no myalgias or arthralgias Neuro: no headache, no focal neurologic deficits Skin: no rash Psyche: Patient states that he is anxious.  Allergies  Codeine  Home Medications   Current Outpatient Rx  Name  Route  Sig  Dispense  Refill  . doxycycline (VIBRA-TABS) 100 MG tablet   Oral   Take 1 tablet (100 mg total) by mouth 2 (two) times daily.   20 tablet   0   . gentamicin (GARAMYCIN) 0.3 % ophthalmic solution      2 drops R eye q 2 hr today then every 4 hr for 2 more days   5 mL   0   . HYDROcodone-acetaminophen (NORCO) 5-325 MG per tablet   Oral   Take 1 tablet by mouth every 4 (four) hours as needed for pain.   10 tablet   0   . mupirocin ointment (BACTROBAN) 2 %   Topical   Apply topically 3 (three) times daily.   22 g   0   . ofloxacin (OCUFLOX) 0.3 % ophthalmic solution   Left Eye   Place 1 drop into the left eye every 4 (four) hours.   5 mL   0    There were no vitals taken for this visit. Physical Exam Gen: well developed and well nourished appearing Head: NCAT Eyes: PERL, EOMI Nose: no epistaixis or rhinorrhea Mouth/throat: mucosa is moist and pink Neck: supple, no stridor Lungs: CTA B,  no wheezing, rhonchi or rales, RR 28/min CV: rapid and irregularly irregular, pulse 108, extremities well perfused Abd: soft, notender, nondistended Back: no ttp, no cva ttp Skin: no rash, wnl Neuro: CN ii-xii grossly intact, no focal deficits Psyche; very anxious affect, cooperative.   ED Course  Procedures (including critical care time)  Results for orders placed during the hospital encounter of 12/25/12 (from the past 24 hour(s))  CBC     Status: Abnormal   Collection Time    12/25/12  4:56 AM      Result Value Range   WBC 12.1 (*) 4.0 - 10.5 K/uL   RBC 5.05  4.22 - 5.81 MIL/uL   Hemoglobin 16.1  13.0 - 17.0 g/dL   HCT 16.1  09.6 - 04.5 %   MCV 88.9  78.0 - 100.0 fL   MCH 31.9  26.0  - 34.0 pg   MCHC 35.9  30.0 - 36.0 g/dL   RDW 40.9  81.1 - 91.4 %   Platelets 250  150 - 400 K/uL  COMPREHENSIVE METABOLIC PANEL     Status: None   Collection Time    12/25/12  4:56 AM      Result Value Range   Sodium 137  135 - 145 mEq/L   Potassium 3.6  3.5 - 5.1 mEq/L   Chloride 100  96 - 112 mEq/L   CO2 23  19 - 32 mEq/L   Glucose, Bld 88  70 - 99 mg/dL   BUN 13  6 - 23 mg/dL   Creatinine, Ser 7.82  0.50 - 1.35 mg/dL   Calcium 9.5  8.4 - 95.6 mg/dL   Total Protein 7.4  6.0 - 8.3 g/dL   Albumin 4.1  3.5 - 5.2 g/dL   AST 21  0 - 37 U/L   ALT 23  0 - 53 U/L   Alkaline Phosphatase 77  39 - 117 U/L   Total Bilirubin 0.5  0.3 - 1.2 mg/dL   GFR calc non Af Amer >90  >90 mL/min   GFR calc Af Amer >90  >90 mL/min  D-DIMER, QUANTITATIVE     Status: None   Collection Time    12/25/12  4:56 AM      Result Value Range   D-Dimer, Quant <0.27  0.00 - 0.48 ug/mL-FEU  MAGNESIUM     Status: None   Collection Time    12/25/12  4:56 AM      Result Value Range   Magnesium 2.1  1.5 - 2.5 mg/dL  POCT I-STAT TROPONIN I     Status: None   Collection Time    12/25/12  5:07 AM      Result Value Range   Troponin i, poc 0.00  0.00 - 0.08 ng/mL   Comment 3            CXR: normal cardiac silloute, normal appearing mediastinum, no infiltrates, no acute process identified.   EKG: atrial fibrillation with ventricular rate of 103, questionable < 1mm ST elevation in inferior leads, no recipricol changes,  normal axis, normal qrs complex    MDM   Patient presents with new onset AF which has spontaneously converted. WBC mildly elevated.  Thyroid studies pending. Case discussed with Cardiologist on call. He recommends initiating metoprolol at 25mg  bid. [facility] will call the patient on Monday to arrange follow up. Plan discussed with patient.   Jonathan Loosen, MD 12/26/12 2227

## 2012-12-25 NOTE — Progress Notes (Signed)
Dr. Shirlee Latch passed on a request from the ER for this patient to have f/u in the office next week for afib. I have left a message on our office's scheduling voicemail requesting a follow-up appointment, and our office will call the patient with this appointment. Dayna Dunn PA-C

## 2012-12-25 NOTE — ED Notes (Signed)
MD at bedside. 

## 2012-12-25 NOTE — ED Notes (Signed)
Pt attempting to get Urine specimen.  Wife at bedside assisting pt

## 2012-12-25 NOTE — Progress Notes (Signed)
Met patient at bedside. Role of case manager explained to patient.Patient verbalizes understanding.Patient reports chest discomfort as reason for today's  ED visit.Patient reports he does   Not have a PCP.Patinet provided education for PCP resources-resource sheet for the [facility] cone clinic /Urgent care.Patient reports he will follow up next week and make an appointment.  Patient provided with a  [place] [facility]  prescription discount drug card.Patient receptive to resources.No further case manager needs identified at this  time.

## 2012-12-25 NOTE — ED Notes (Signed)
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

## 2012-12-25 NOTE — ED Notes (Signed)
Pt. woke up this morning with palpitations " heart racing" , denies chest pain , pt. sated he took cough medication before going to bed this evening .

## 2012-12-25 NOTE — ED Notes (Signed)
ASA given

## 2012-12-25 NOTE — ED Notes (Signed)
Pt anxious to go home, denies pain at present pt in NSR

## 2012-12-25 NOTE — ED Notes (Signed)
Pt sleeping, awakens to physical stimuli.  C-collar remains in place.

## 2013-01-04 ENCOUNTER — Encounter: Payer: Self-pay | Admitting: *Deleted

## 2013-01-14 ENCOUNTER — Encounter: Payer: Self-pay | Admitting: Cardiology

## 2013-01-14 ENCOUNTER — Ambulatory Visit (INDEPENDENT_AMBULATORY_CARE_PROVIDER_SITE_OTHER): Payer: BC Managed Care – PPO | Admitting: Cardiology

## 2013-01-14 VITALS — BP 136/84 | HR 86 | Wt 261.0 lb

## 2013-01-14 DIAGNOSIS — I4891 Unspecified atrial fibrillation: Secondary | ICD-10-CM

## 2013-01-14 MED ORDER — ASPIRIN EC 325 MG PO TBEC: 325.0000 mg | DELAYED_RELEASE_TABLET | Freq: Every day | ORAL | Status: DC

## 2013-01-14 NOTE — Patient Instructions (Addendum)
Your physician has recommended that you wear a 48 hour holter monitor. Holter monitors are medical devices that record the heart's electrical activity. Doctors most often use these monitors to diagnose arrhythmias. Arrhythmias are problems with the speed or rhythm of the heartbeat. The monitor is a small, portable device. You can wear one while you do your normal daily activities. This is usually used to diagnose what is causing palpitations/syncope (passing out).  Your physician has requested that you have an echocardiogram. Echocardiography is a painless test that uses sound waves to create images of your heart. It provides your doctor with information about the size and shape of your heart and how well your heart's chambers and valves are working. This procedure takes approximately one hour. There are no restrictions for this procedure.  Your physician has recommended you make the following change in your medication:  1) Start Aspirin 325 mg daily  Your physician recommends that you schedule a follow-up appointment in: 2 weeks with Dr. Delton See (pt requesting a Friday)

## 2013-01-14 NOTE — Progress Notes (Signed)
Patient ID: Jonathan Russell, male   DOB: 11-26-1985, 27 y.o.   MRN: [id number]    Patient Name: Jonathan Russell Date of Encounter: 01/14/2013  Primary Care Provider:  No PCP Per Patient Primary Cardiologist:  Tobias Alexander, H   Patient Profile  Follow up after ER visit for new diagnosis of atrial fibrillation  Problem List   No past medical history on file. Past Surgical History  Procedure Laterality Date  . Hip surgery      Allergies  Allergies  Allergen Reactions  . Codeine Nausea Only    HPI  27 year old male, obese, but otherwise previously healthy was recently seen in the ER for palpitations and was found to be in atrial fibrillation with RVR.  It woke him from sleep, he felt little lightheaded and "funny", no chest pain or SOB. He spontaneously cardioverted in the ER and was sent home on Metoprolol 25 mg PO BID. The patient he felt tired after taking it so he stopped. He didn't experience any more episodes of palpitations since 12/26/2012. He is current smoker of tobacco and marijuana, but denies any illicit drugs, excessive alcohol or energy drink intake.  Home Medications  Prior to Admission medications   Medication Sig Start Date End Date Taking? Authorizing Provider  aspirin EC 325 MG tablet Take 1 tablet (325 mg total) by mouth daily. 01/14/13   Lars Masson, MD    Family History  Family History  Problem Relation Age of Onset  . Dysrhythmia Mother     Social History  History   Social History  . Marital Status: Single    Spouse Name: N/A    Number of Children: N/A  . Years of Education: N/A   Occupational History  . Not on file.   Social History Main Topics  . Smoking status: Current Every Day Smoker -- 1.00 packs/day    Types: Cigarettes  . Smokeless tobacco: Not on file  . Alcohol Use: Yes  . Drug Use: No  . Sexual Activity: Not on file   Other Topics Concern  . Not on file   Social History Narrative  . No narrative on file      Review of Systems General:  No chills, fever, night sweats or weight changes.  Cardiovascular:  No chest pain, dyspnea on exertion, edema, orthopnea, palpitations, paroxysmal nocturnal dyspnea. Dermatological: No rash, lesions/masses Respiratory: No cough, dyspnea Urologic: No hematuria, dysuria Abdominal:   No nausea, vomiting, diarrhea, bright red blood per rectum, melena, or hematemesis Neurologic:  No visual changes, wkns, changes in mental status. All other systems reviewed and are otherwise negative except as noted above.  Physical Exam  Blood pressure 136/84, pulse 86, weight 261 lb (118.389 kg).  General: Pleasant, NAD Psych: Normal affect. Neuro: Alert and oriented X 3. Moves all extremities spontaneously. HEENT: Normal  Neck: Supple without bruits or JVD. Lungs:  Resp regular and unlabored, CTA. Heart: RRR no s3, s4, or murmurs. Abdomen: Soft, non-tender, non-distended, BS + x 4.  Extremities: No clubbing, cyanosis or edema. DP/PT/Radials 2+ and equal bilaterally.    Assessment & Plan  27 year old male with new diagnosis of paroxysmal atrial fibrillation on 12/25/2012.  He is currently only on aspirin 325 mg daily. He didn't tolerate betablockers. We will order echocardiogram to assess his anatomy and function and to plan for appropriate antiarrhythmic. Possibly propafenone might be an option for him to prevent further episodes. He is CHADS2 score 0, no need for any  anticoagulation. We will order 48 Holter monitor to see if there are any episodes of asymptomatic a-fib to decide for further management (ablation vs anti-arrhythmics).  Follow up in 2 weeks.  Tobias Alexander, Rexene Edison, MD  01/14/2013, 1:21 PM

## 2013-01-21 ENCOUNTER — Encounter: Payer: Self-pay | Admitting: Radiology

## 2013-01-21 ENCOUNTER — Encounter (INDEPENDENT_AMBULATORY_CARE_PROVIDER_SITE_OTHER): Payer: BC Managed Care – PPO

## 2013-01-21 DIAGNOSIS — I4891 Unspecified atrial fibrillation: Secondary | ICD-10-CM

## 2013-01-21 NOTE — Progress Notes (Signed)
Patient ID: Jonathan Russell, male   DOB: [birth year], 26 y.o.   MRN: [id number] E cardio 24hr Holter Monitor applied

## 2013-01-27 ENCOUNTER — Telehealth: Payer: Self-pay

## 2013-01-27 NOTE — Telephone Encounter (Signed)
**Note De-Identified Coralynn Gaona Obfuscation** Per Dr Delton See, pt given normal Holter monitor results. He verbalized understanding.

## 2013-01-28 ENCOUNTER — Encounter: Payer: Self-pay | Admitting: Cardiology

## 2013-01-28 ENCOUNTER — Other Ambulatory Visit (HOSPITAL_COMMUNITY): Payer: Self-pay | Admitting: Cardiology

## 2013-01-28 ENCOUNTER — Ambulatory Visit (INDEPENDENT_AMBULATORY_CARE_PROVIDER_SITE_OTHER): Payer: BC Managed Care – PPO | Admitting: Cardiology

## 2013-01-28 ENCOUNTER — Encounter (INDEPENDENT_AMBULATORY_CARE_PROVIDER_SITE_OTHER): Payer: Self-pay

## 2013-01-28 ENCOUNTER — Ambulatory Visit (HOSPITAL_COMMUNITY): Payer: BC Managed Care – PPO | Attending: Internal Medicine | Admitting: Radiology

## 2013-01-28 VITALS — BP 122/84 | HR 82 | Ht 69.0 in | Wt 265.0 lb

## 2013-01-28 DIAGNOSIS — I4891 Unspecified atrial fibrillation: Secondary | ICD-10-CM | POA: Insufficient documentation

## 2013-01-28 DIAGNOSIS — F17200 Nicotine dependence, unspecified, uncomplicated: Secondary | ICD-10-CM | POA: Insufficient documentation

## 2013-01-28 MED ORDER — PROPAFENONE HCL 150 MG PO TABS: ORAL_TABLET | ORAL | Status: DC

## 2013-01-28 NOTE — Progress Notes (Signed)
Echocardiogram performed.  

## 2013-01-28 NOTE — Progress Notes (Signed)
Patient ID: Jonathan Russell, male   DOB: [birth year], 27 y.o.   MRN: [id number]   Patient Name: Jonathan Russell Date of Encounter: 01/28/2013  Primary Care Provider:  No PCP Per Patient Primary Cardiologist:  Tobias Alexander, H   Patient Profile  Follow up after ER visit for new diagnosis of atrial fibrillation  Problem List   No past medical history on file. Past Surgical History  Procedure Laterality Date  . Hip surgery      Allergies  Allergies  Allergen Reactions  . Codeine Nausea Only    HPI  27 year old male, obese, but otherwise previously healthy was recently seen in the ER for palpitations and was found to be in atrial fibrillation with RVR.  It woke him from sleep, he felt little lightheaded and "funny", no chest pain or SOB. He spontaneously cardioverted in the ER and was sent home on Metoprolol 25 mg PO BID. The patient he felt tired after taking it so he stopped. He didn't experience any more episodes of palpitations since 12/26/2012. He is current smoker of tobacco and marijuana, but denies any illicit drugs, excessive alcohol or energy drink intake.  This is a 2 week follow up, no palpitations since the last visit.  Home Medications  Prior to Admission medications   Medication Sig Start Date End Date Taking? Authorizing Provider  aspirin EC 325 MG tablet Take 1 tablet (325 mg total) by mouth daily. 01/14/13   Lars Masson, MD    Family History  Family History  Problem Relation Age of Onset  . Dysrhythmia Mother     Social History  History   Social History  . Marital Status: Single    Spouse Name: N/A    Number of Children: N/A  . Years of Education: N/A   Occupational History  . Not on file.   Social History Main Topics  . Smoking status: Current Every Day Smoker -- 1.00 packs/day    Types: Cigarettes  . Smokeless tobacco: Not on file  . Alcohol Use: Yes  . Drug Use: No  . Sexual Activity: Not on file   Other Topics Concern   . Not on file   Social History Narrative  . No narrative on file     Review of Systems General:  No chills, fever, night sweats or weight changes.  Cardiovascular:  No chest pain, dyspnea on exertion, edema, orthopnea, palpitations, paroxysmal nocturnal dyspnea. Dermatological: No rash, lesions/masses Respiratory: No cough, dyspnea Urologic: No hematuria, dysuria Abdominal:   No nausea, vomiting, diarrhea, bright red blood per rectum, melena, or hematemesis Neurologic:  No visual changes, wkns, changes in mental status. All other systems reviewed and are otherwise negative except as noted above.  Physical Exam  Blood pressure 122/84, pulse 82, height 5\' 9"  (1.753 m), weight 265 lb (120.203 kg).  General: Pleasant, NAD Psych: Normal affect. Neuro: Alert and oriented X 3. Moves all extremities spontaneously. HEENT: Normal  Neck: Supple without bruits or JVD. Lungs:  Resp regular and unlabored, CTA. Heart: RRR no s3, s4, or murmurs. Abdomen: Soft, non-tender, non-distended, BS + x 4.  Extremities: No clubbing, cyanosis or edema. DP/PT/Radials 2+ and equal bilaterally.    Assessment & Plan  27 year old male with new diagnosis of paroxysmal atrial fibrillation on 12/25/2012.  He is currently only on aspirin 325 mg daily. He didn't tolerate betablockers.  Echocardiogram shows normal systolic and diastolic LV function and normal LA size. 48 hour Holter monitor didn't show  any episodes of any arrhythmia, only 2 PACs in 48 hours.  Considering the patient only had 1 episode of a-fib we will not place him on long term anti-arrhythmics. We will start Propafenone 600 mgas " pill in the pocket", if a-fib persists > 24 H the patient is instructed to call us or go to the ER.  He is CHADS2 score 0, no need for any anticoagulation.   F/u in 6 months.   Tobias Alexander, Rexene Edison, MD  01/28/2013, 9:50 AM

## 2013-01-28 NOTE — Patient Instructions (Addendum)
Start Rythmol 600 mg take if needed don't take no more than one per day.    Your physician wants you to follow-up in: 6 months You will receive a reminder letter in the mail two months in advance. If you don't receive a letter, please call our office to schedule the follow-up appointment.

## 2013-05-28 ENCOUNTER — Ambulatory Visit (INDEPENDENT_AMBULATORY_CARE_PROVIDER_SITE_OTHER): Payer: BC Managed Care – PPO | Admitting: Family Medicine

## 2013-05-28 VITALS — BP 124/88 | HR 76 | Temp 97.5°F | Resp 16 | Ht 69.5 in | Wt 264.0 lb

## 2013-05-28 DIAGNOSIS — J069 Acute upper respiratory infection, unspecified: Secondary | ICD-10-CM

## 2013-05-28 DIAGNOSIS — R059 Cough, unspecified: Secondary | ICD-10-CM

## 2013-05-28 DIAGNOSIS — J029 Acute pharyngitis, unspecified: Secondary | ICD-10-CM

## 2013-05-28 DIAGNOSIS — R05 Cough: Secondary | ICD-10-CM

## 2013-05-28 LAB — POCT RAPID STREP A (OFFICE): Rapid Strep A Screen: NEGATIVE

## 2013-05-28 MED ORDER — HYDROCOD POLST-CHLORPHEN POLST 10-8 MG/5ML PO LQCR: 5.0000 mL | Freq: Every evening | ORAL | Status: DC | PRN

## 2013-05-28 MED ORDER — HYDROCODONE-HOMATROPINE 5-1.5 MG/5ML PO SYRP: 5.0000 mL | ORAL_SOLUTION | Freq: Every evening | ORAL | Status: DC | PRN

## 2013-05-28 MED ORDER — AMOXICILLIN 875 MG PO TABS: 875.0000 mg | ORAL_TABLET | Freq: Two times a day (BID) | ORAL | Status: DC

## 2013-05-28 NOTE — Progress Notes (Signed)
Chief Complaint:  Chief Complaint  Patient presents with  . Sore Throat    x 1 week    HPI: Jonathan Russell is a 28 y.o. male who is here for a sore throat. He has had the sore throat for little over a week. He had some sinus congestion about a week ago, but says that has cleared up. He says it's hard to swallow and the past couple days it was hard for him to talk without pain. He also has had a little bit of a cough for the past week. Otherwise he has no other concerns. Has not had fevers or chills, Has tried otc cough drops and syrup and has not helped him. He has been around sick people. Used to get sore throat a lot as a child. He ahs had no CP or SOB, is a smoker so has had some wheezing. He has had productive cough.   History reviewed. No pertinent past medical history. Past Surgical History  Procedure Laterality Date  . Hip surgery     History   Social History  . Marital Status: Single    Spouse Name: N/A    Number of Children: N/A  . Years of Education: N/A   Social History Main Topics  . Smoking status: Current Every Day Smoker -- 1.00 packs/day    Types: Cigarettes  . Smokeless tobacco: None  . Alcohol Use: Yes  . Drug Use: No  . Sexual Activity: None   Other Topics Concern  . None   Social History Narrative  . None   Family History  Problem Relation Age of Onset  . Dysrhythmia Mother    Allergies  Allergen Reactions  . Codeine Nausea Only   Prior to Admission medications   Medication Sig Start Date End Date Taking? Authorizing Provider  aspirin EC 325 MG tablet Take 1 tablet (325 mg total) by mouth daily. 01/14/13   Lars MassonKatarina H Nelson, MD  propafenone (RYTHMOL) 150 MG tablet Take 4 tablets ( 600 mg ) daily if needed  ( no more than 600 mg in 24 hours ) 01/28/13   Lars MassonKatarina H Nelson, MD     ROS: The patient denies fevers, chills, night sweats, unintentional weight loss, chest pain, palpitations, wheezing, dyspnea on exertion, nausea, vomiting,  abdominal pain, dysuria, hematuria, melena, numbness, weakness, or tingling.   All other systems have been reviewed and were otherwise negative with the exception of those mentioned in the HPI and as above.    PHYSICAL EXAM: Filed Vitals:   05/28/13 0955  BP: 124/88  Pulse: 76  Temp: 97.5 F (36.4 C)  Resp: 16   Filed Vitals:   05/28/13 0955  Height: 5' 9.5" (1.765 m)  Weight: 264 lb (119.75 kg)   Body mass index is 38.44 kg/(m^2).  General: Alert, no acute distress HEENT:  Normocephalic, atraumatic, oropharynx patent. EOMI, PERRLA, TM nl, erytheamtous throat Cardiovascular:  Regular rate and rhythm, no rubs murmurs or gallops.  No Carotid bruits, radial pulse intact. No pedal edema.  Respiratory: Clear to auscultation bilaterally.  No wheezes, rales, or rhonchi.  No cyanosis, no use of accessory musculature GI: No organomegaly, abdomen is soft and non-tender, positive bowel sounds.  No masses. Skin: No rashes. Neurologic: Facial musculature symmetric. Psychiatric: Patient is appropriate throughout our interaction. Lymphatic: No cervical lymphadenopathy Musculoskeletal: Gait intact.   LABS: Results for orders placed in visit on 05/28/13  POCT RAPID STREP A (OFFICE)  Result Value Range   Rapid Strep A Screen Negative  Negative     EKG/XRAY:   Primary read interpreted by Dr. Conley Rolls at [facility].   ASSESSMENT/PLAN: Encounter Diagnoses  Name Primary?  . Sore throat Yes  . URI, acute   . Cough    Amoxacillin 875 mg PO BID Throat cx pending Tussionex ( pt unable to take hycodan or ceodeine but tussionex is ok) F/u prn   Gross sideeffects, risk and benefits, and alternatives of medications d/w patient. Patient is aware that all medications have potential sideeffects and we are unable to predict every sideeffect or drug-drug interaction that may occur.  LE, THAO PHUONG, DO 05/28/2013 1:50 PM

## 2013-05-28 NOTE — Patient Instructions (Signed)
Pharyngitis °Pharyngitis is redness, pain, and swelling (inflammation) of your pharynx.  °CAUSES  °Pharyngitis is usually caused by infection. Most of the time, these infections are from viruses (viral) and are part of a cold. However, sometimes pharyngitis is caused by bacteria (bacterial). Pharyngitis can also be caused by allergies. Viral pharyngitis may be spread from person to person by coughing, sneezing, and personal items or utensils (cups, forks, spoons, toothbrushes). Bacterial pharyngitis may be spread from person to person by more intimate contact, such as kissing.  °SIGNS AND SYMPTOMS  °Symptoms of pharyngitis include:   °· Sore throat.   °· Tiredness (fatigue).   °· Low-grade fever.   °· Headache. °· Joint pain and muscle aches. °· Skin rashes. °· Swollen lymph nodes. °· Plaque-like film on throat or tonsils (often seen with bacterial pharyngitis). °DIAGNOSIS  °Your health care provider will ask you questions about your illness and your symptoms. Your medical history, along with a physical exam, is often all that is needed to diagnose pharyngitis. Sometimes, a rapid strep test is done. Other lab tests may also be done, depending on the suspected cause.  °TREATMENT  °Viral pharyngitis will usually get better in 3 4 days without the use of medicine. Bacterial pharyngitis is treated with medicines that kill germs (antibiotics).  °HOME CARE INSTRUCTIONS  °· Drink enough water and fluids to keep your urine clear or pale yellow.   °· Only take over-the-counter or prescription medicines as directed by your health care provider:   °· If you are prescribed antibiotics, make sure you finish them even if you start to feel better.   °· Do not take aspirin.   °· Get lots of rest.   °· Gargle with 8 oz of salt water (½ tsp of salt per 1 qt of water) as often as every 1 2 hours to soothe your throat.   °· Throat lozenges (if you are not at risk for choking) or sprays may be used to soothe your throat. °SEEK MEDICAL  CARE IF:  °· You have large, tender lumps in your neck. °· You have a rash. °· You cough up green, yellow-brown, or bloody spit. °SEEK IMMEDIATE MEDICAL CARE IF:  °· Your neck becomes stiff. °· You drool or are unable to swallow liquids. °· You vomit or are unable to keep medicines or liquids down. °· You have severe pain that does not go away with the use of recommended medicines. °· You have trouble breathing (not caused by a stuffy nose). °MAKE SURE YOU:  °· Understand these instructions. °· Will watch your condition. °· Will get help right away if you are not doing well or get worse. °Document Released: 04/07/2005 Document Revised: 01/26/2013 Document Reviewed: 12/13/2012 °ExitCare® Patient Information ©2014 [employer]. ° °

## 2013-05-30 LAB — CULTURE, GROUP A STREP: Organism ID, Bacteria: NORMAL

## 2013-06-06 ENCOUNTER — Encounter: Payer: Self-pay | Admitting: Family Medicine

## 2013-10-01 ENCOUNTER — Encounter (HOSPITAL_COMMUNITY): Payer: Self-pay | Admitting: Emergency Medicine

## 2013-10-01 ENCOUNTER — Emergency Department (HOSPITAL_COMMUNITY)
Admission: EM | Admit: 2013-10-01 | Discharge: 2013-10-01 | Disposition: A | Discharge status: Home / Self Care | Payer: BC Managed Care – PPO | Source: Home / Self Care | Attending: Emergency Medicine | Admitting: Emergency Medicine

## 2013-10-01 DIAGNOSIS — L03119 Cellulitis of unspecified part of limb: Secondary | ICD-10-CM

## 2013-10-01 DIAGNOSIS — L02419 Cutaneous abscess of limb, unspecified: Secondary | ICD-10-CM

## 2013-10-01 DIAGNOSIS — L03115 Cellulitis of right lower limb: Secondary | ICD-10-CM

## 2013-10-01 MED ORDER — CEPHALEXIN 500 MG PO CAPS: 500.0000 mg | ORAL_CAPSULE | Freq: Four times a day (QID) | ORAL | Status: DC

## 2013-10-01 NOTE — ED Notes (Signed)
Pt  Has  Multiple   sm  Red  Lesions  On   extremitys  And  Back   That itch    As  Well  As  A  swollen    Slightly    Discolored  Area  To  r  Inner  Thigh     X  sev  Weeks  Pt  Reports  He  Gets  These  reoccuring this  Time  On a  Recent  Yearly  Bases No  Angioedema    Pt  Has  A  Dog

## 2013-10-01 NOTE — Discharge Instructions (Signed)
Cellulitis Cellulitis is an infection of the skin and the tissue beneath it. The infected area is usually red and tender. Cellulitis occurs most often in the arms and lower legs.  CAUSES  Cellulitis is caused by bacteria that enter the skin through cracks or cuts in the skin. The most common types of bacteria that cause cellulitis are Staphylococcus and Streptococcus. SYMPTOMS   Redness and warmth.  Swelling.  Tenderness or pain.  Fever. DIAGNOSIS  Your caregiver can usually determine what is wrong based on a physical exam. Blood tests may also be done. TREATMENT  Treatment usually involves taking an antibiotic medicine. HOME CARE INSTRUCTIONS   Take your antibiotics as directed. Finish them even if you start to feel better.  Keep the infected arm or leg elevated to reduce swelling.  Apply a warm cloth to the affected area up to 4 times per day to relieve pain.  Only take over-the-counter or prescription medicines for pain, discomfort, or fever as directed by your caregiver.  Keep all follow-up appointments as directed by your caregiver. SEEK MEDICAL CARE IF:   You notice red streaks coming from the infected area.  Your red area gets larger or turns dark in color.  Your bone or joint underneath the infected area becomes painful after the skin has healed.  Your infection returns in the same area or another area.  You notice a swollen bump in the infected area.  You develop new symptoms. SEEK IMMEDIATE MEDICAL CARE IF:   You have a fever.  You feel very sleepy.  You develop vomiting or diarrhea.  You have a general ill feeling (malaise) with muscle aches and pains. MAKE SURE YOU:   Understand these instructions.  Will watch your condition.  Will get help right away if you are not doing well or get worse. Document Released: 01/15/2005 Document Revised: 10/07/2011 Document Reviewed: 06/23/2011 ExitCare Patient Information 2014 [employer].  

## 2013-10-01 NOTE — ED Provider Notes (Signed)
Medical screening examination/treatment/procedure(s) were performed by non-physician practitioner and as supervising physician I was immediately available for consultation/collaboration.  Leslee Homeavid Jamarius Saha, M.D.  Reuben Likesavid C Kaven Cumbie, MD 10/01/13 [phone]

## 2013-10-01 NOTE — ED Provider Notes (Signed)
CSN: [id number]     Arrival date & time 10/01/13  1832 History   First MD Initiated Contact with Patient 10/01/13 1903     Chief Complaint  Patient presents with  . Insect Bite   (Consider location/radiation/quality/duration/timing/severity/associated sxs/prior Treatment) HPI Comments: Patient states he was bitten several days ago by an unknown insect at his right medial thigh and area has become red, swollen and tender. No fever/chills. States area is improving but he feels he needs and antibiotics to finish healing PCP: none Works in [employer]  The history is provided by the patient.    History reviewed. No pertinent past medical history. Past Surgical History  Procedure Laterality Date  . Hip surgery     Family History  Problem Relation Age of Onset  . Dysrhythmia Mother    History  Substance Use Topics  . Smoking status: Current Every Day Smoker -- 1.00 packs/day    Types: Cigarettes  . Smokeless tobacco: Not on file  . Alcohol Use: Yes    Review of Systems  All other systems reviewed and are negative.   Allergies  Codeine  Home Medications   Prior to Admission medications   Medication Sig Start Date End Date Taking? Authorizing Provider  amoxicillin (AMOXIL) 875 MG tablet Take 1 tablet (875 mg total) by mouth 2 (two) times daily. 05/28/13   Thao P Le, DO  cephALEXin (KEFLEX) 500 MG capsule Take 1 capsule (500 mg total) by mouth 4 (four) times daily. X 7 days 10/01/13   Ardis RowanJennifer Lee Kaidan Spengler, PA  chlorpheniramine-HYDROcodone [facility](TUSSIONEX PENNKINETIC ER) 10-8 MG/5ML LQCR Take 5 mLs by mouth at bedtime as needed for cough. 05/28/13   Thao P Le, DO   BP 148/94  Pulse 74  Temp(Src) 99.4 F (37.4 C) (Oral)  Resp 16  SpO2 97% Physical Exam  Nursing note and vitals reviewed. Constitutional: He is oriented to person, place, and time. He appears well-developed and well-nourished.  +obese  HENT:  Head: Normocephalic and atraumatic.  Eyes: Conjunctivae are normal. No  scleral icterus.  Cardiovascular: Normal rate.   Pulmonary/Chest: Effort normal.  Musculoskeletal: Normal range of motion.  Neurological: He is alert and oriented to person, place, and time.  Skin: Skin is warm and dry.  8 cm x 10 cm area of erythema and induration without palpable fluctuance at right upper medial thigh  Psychiatric: He has a normal mood and affect. His behavior is normal.    ED Course  Procedures (including critical care time) Labs Review Labs Reviewed - No data to display  Imaging Review No results found.   MDM   1. Cellulitis of right thigh    Warm compresses QID until healed. Cephalexin as prescribed with follow up if no improvement.    Jess BartersJennifer Lee Wright[place], [place] 10/01/13 [phone]

## 2013-11-28 ENCOUNTER — Ambulatory Visit (INDEPENDENT_AMBULATORY_CARE_PROVIDER_SITE_OTHER): Payer: BC Managed Care – PPO | Admitting: Internal Medicine

## 2013-11-28 VITALS — BP 140/80 | HR 74 | Temp 98.1°F | Resp 18 | Ht 69.5 in | Wt 269.0 lb

## 2013-11-28 DIAGNOSIS — R05 Cough: Secondary | ICD-10-CM

## 2013-11-28 DIAGNOSIS — J019 Acute sinusitis, unspecified: Secondary | ICD-10-CM

## 2013-11-28 DIAGNOSIS — R059 Cough, unspecified: Secondary | ICD-10-CM

## 2013-11-28 MED ORDER — HYDROCOD POLST-CHLORPHEN POLST 10-8 MG/5ML PO LQCR: 5.0000 mL | Freq: Two times a day (BID) | ORAL | Status: DC | PRN

## 2013-11-28 MED ORDER — AMOXICILLIN 500 MG PO CAPS: 1000.0000 mg | ORAL_CAPSULE | Freq: Two times a day (BID) | ORAL | Status: AC

## 2013-11-28 NOTE — Progress Notes (Signed)
   Subjective:    Patient ID: Jonathan Russell, male    DOB: 12/11/1985, 28 y.o.   MRN: [id number] This chart was scribed for Jonathan Bananaobert Doolittles, MD by Jonathan Russell, ED scribe. This patient was seen in room Room/bed 09 and the patient's care was started at 2:44 PM.   Sinus Problem Associated symptoms include congestion, coughing and sinus pressure.   Chief Complaint  Patient presents with  . Cough    x 3 days  . Sinus Problem    x 3 days     HPI Comments:  Jonathan Russell is a 28 y.o. male [facility] problems, with associated symptoms of cough. Pt states that he was not able to go to work today due to symptoms.Pt states that he is allergic to codeine. He has been ill for 4 days and has had little sleep due to this cough. No fever  He is a smoker but has no history of asthma  Review of Systems  HENT: Positive for congestion and sinus pressure.   Respiratory: Positive for cough.        Objective:   Physical Exam  Nursing note and vitals reviewed. Constitutional: He is oriented to person, place, and time. He appears well-developed and well-nourished. No distress.  HENT:  Head: Normocephalic and atraumatic.  Right Ear: Tympanic membrane and external ear normal.  Left Ear: Tympanic membrane and external ear normal.  Mouth/Throat: Oropharynx is clear and moist.  Nares have purulent mucus/tender maxillary areas to percussion   Eyes: EOM are normal.  Neck: Normal range of motion. Neck supple. No thyromegaly present.  Cardiovascular: Normal rate.   Pulmonary/Chest: Effort normal and breath sounds normal. He has no wheezes. He has no rales.  Musculoskeletal: Normal range of motion.  Lymphadenopathy:    He has no cervical adenopathy.  Neurological: He is alert and oriented to person, place, and time.  Skin: Skin is warm and dry.  Psychiatric: He has a normal mood and affect. His behavior is normal.   BP 140/80  Pulse 74  Temp(Src) 98.1 F  (36.7 C) (Oral)  Resp 18  Ht 5' 9.5" (1.765 m)  Wt 269 lb (122.018 kg)  BMI 39.17 kg/m2  SpO2 97% Wt Readings from Last 3 Encounters:  11/28/13 269 lb (122.018 kg)  05/28/13 264 lb (119.75 kg)  01/28/13 265 lb (120.203 kg)          Assessment & Plan:  I have completed the patient encounter in its entirety as documented by the scribe, with editing by me where necessary. Jonathan Russell, M.D. Acute sinusitis, unspecified  Cough  Meds ordered this encounter  Medications  . amoxicillin (AMOXIL) 500 MG capsule    Sig: Take 2 capsules (1,000 mg total) by mouth 2 (two) times daily.    Dispense:  40 capsule    Refill:  0  . chlorpheniramine-HYDROcodone (TUSSIONEX PENNKINETIC ER) 10-8 MG/5ML LQCR    Sig: Take 5 mLs by mouth every 12 (twelve) hours as needed for cough.    Dispense:  115 mL    Refill:  0   He needs to work on his underlying problems of obesity and nicotine use

## 2013-11-28 NOTE — Progress Notes (Deleted)
   Subjective:    Patient ID: Jonathan Russell, male    DOB: 12/24/1985, 28 y.o.   MRN: [id number]  HPI    Review of Systems     Objective:   Physical Exam        Assessment & Plan:

## 2014-03-21 ENCOUNTER — Ambulatory Visit (INDEPENDENT_AMBULATORY_CARE_PROVIDER_SITE_OTHER): Payer: BC Managed Care – PPO | Admitting: Emergency Medicine

## 2014-03-21 VITALS — BP 126/80 | HR 86 | Temp 98.1°F | Resp 16 | Ht 70.5 in | Wt 275.2 lb

## 2014-03-21 DIAGNOSIS — J0140 Acute pansinusitis, unspecified: Secondary | ICD-10-CM

## 2014-03-21 DIAGNOSIS — J209 Acute bronchitis, unspecified: Secondary | ICD-10-CM

## 2014-03-21 MED ORDER — AMOXICILLIN-POT CLAVULANATE 875-125 MG PO TABS: 1.0000 | ORAL_TABLET | Freq: Two times a day (BID) | ORAL | Status: DC

## 2014-03-21 MED ORDER — HYDROCOD POLST-CHLORPHEN POLST 10-8 MG/5ML PO LQCR: 5.0000 mL | Freq: Two times a day (BID) | ORAL | Status: DC | PRN

## 2014-03-21 NOTE — Progress Notes (Signed)
[facility] [address], [place] [address] [phone]  Date:  03/21/2014   Name:  Jonathan Russell   DOB:  10/06/1985   MRN:  [id number]  PCP:  No PCP Per Patient    Chief Complaint: Sore Throat; Cough; and Nasal Congestion   History of Present Illness:  Jonathan Russell is a 28 y.o. very pleasant male patient who presents with the following:  Ill for several weeks with nasal congestion and nasal discharge.  Purulent post nasal drip. Has a sore throat and fevered feeling. Cough productive of purulent sputum.  No wheezing or shortness of breath. No nausea or vomiting. No stool change or rash Malaise,  No improvement with over the counter medications or other home remedies.  Denies other complaint or health concern today.   Patient Active Problem List   Diagnosis Date Noted  . Atrial fibrillation 01/28/2013  . Nicotine addiction 09/20/2012  . BMI 37.0-37.9, adult 09/20/2012    No past medical history on file.  Past Surgical History  Procedure Laterality Date  . Hip surgery      History  Substance Use Topics  . Smoking status: Former Smoker -- 1.00 packs/day    Types: Cigarettes  . Smokeless tobacco: Not on file  . Alcohol Use: 0.0 oz/week    0 Not specified per week    Family History  Problem Relation Age of Onset  . Dysrhythmia Mother     Allergies  Allergen Reactions  . Codeine Nausea Only    Medication list has been reviewed and updated.  Current Outpatient Prescriptions on File Prior to Visit  Medication Sig Dispense Refill  . chlorpheniramine-HYDROcodone (TUSSIONEX PENNKINETIC ER) 10-8 MG/5ML LQCR Take 5 mLs by mouth every 12 (twelve) hours as needed for cough. (Patient not taking: Reported on 03/21/2014) 115 mL 0   No current facility-administered medications on file prior to visit.    Review of Systems:  As per HPI, otherwise negative.    Physical Examination: Filed Vitals:   03/21/14 0933  BP: 126/80   Pulse: 86  Temp: 98.1 F (36.7 C)  Resp: 16   Filed Vitals:   03/21/14 0933  Height: 5' 10.5" (1.791 m)  Weight: 275 lb 3.2 oz (124.83 kg)   Body mass index is 38.92 kg/(m^2). Ideal Body Weight: Weight in (lb) to have BMI = 25: 176.4  GEN: WDWN, NAD, Non-toxic, A & O x 3 HEENT: Atraumatic, Normocephalic. Neck supple. No masses, No LAD. Ears and Nose: No external deformity. CV: RRR, No M/G/R. No JVD. No thrill. No extra heart sounds. PULM: CTA B, no wheezes, crackles, rhonchi. No retractions. No resp. distress. No accessory muscle use. ABD: S, NT, ND, +BS. No rebound. No HSM. EXTR: No c/c/e NEURO Normal gait.  PSYCH: Normally interactive. Conversant. Not depressed or anxious appearing.  Calm demeanor.    Assessment and Plan: Sinusitis Bronchitis augmentin tussionex  Signed,  Phillips OdorJeffery Kimeka Badour, MD

## 2014-03-21 NOTE — Patient Instructions (Signed)

## 2014-04-17 ENCOUNTER — Ambulatory Visit (INDEPENDENT_AMBULATORY_CARE_PROVIDER_SITE_OTHER): Payer: BC Managed Care – PPO | Admitting: Emergency Medicine

## 2014-04-17 VITALS — BP 148/84 | HR 81 | Temp 98.0°F | Resp 18 | Ht 70.5 in | Wt 273.0 lb

## 2014-04-17 DIAGNOSIS — S83242A Other tear of medial meniscus, current injury, left knee, initial encounter: Secondary | ICD-10-CM

## 2014-04-17 MED ORDER — HYDROCODONE-ACETAMINOPHEN 5-325 MG PO TABS: 1.0000 | ORAL_TABLET | ORAL | Status: DC | PRN

## 2014-04-17 MED ORDER — NAPROXEN SODIUM 550 MG PO TABS: 550.0000 mg | ORAL_TABLET | Freq: Two times a day (BID) | ORAL | Status: DC

## 2014-04-17 NOTE — Progress Notes (Signed)
[facility] [address], [place] [id number] [phone]  Date:  04/17/2014   Name:  Jonathan Russell   DOB:  01/27/1986   MRN:  [id number]  PCP:  No PCP Per Patient    Chief Complaint: Knee Pain   History of Present Illness:  Jonathan Russell is a 28 y.o. very pleasant male patient who presents with the following:  Sitting crosslegged wrapping presents and developed medial knee pain and clicking One episode of locking of knee.  Required assistance to straighten No history of discrete injury or overuse. Little swelling.  Persistent pain. No improvement with over the counter medications or other home remedies. .\ Denies other complaint or health concern today.   Patient Active Problem List   Diagnosis Date Noted  . Atrial fibrillation 01/28/2013  . Nicotine addiction 09/20/2012  . BMI 37.0-37.9, adult 09/20/2012    History reviewed. No pertinent past medical history.  Past Surgical History  Procedure Laterality Date  . Hip surgery      History  Substance Use Topics  . Smoking status: Former Smoker -- 1.00 packs/day    Types: Cigarettes  . Smokeless tobacco: Not on file  . Alcohol Use: 0.0 oz/week    0 Not specified per week    Family History  Problem Relation Age of Onset  . Dysrhythmia Mother     Allergies  Allergen Reactions  . Codeine Nausea Only    Medication list has been reviewed and updated.  No current outpatient prescriptions on file prior to visit.   No current facility-administered medications on file prior to visit.    Review of Systems:  As per HPI, otherwise negative.    Physical Examination: Filed Vitals:   04/17/14 1316  BP: 148/84  Pulse: 81  Temp: 98 F (36.7 C)  Resp: 18   Filed Vitals:   04/17/14 1316  Height: 5' 10.5" (1.791 m)  Weight: 273 lb (123.832 kg)   Body mass index is 38.6 kg/(m^2). Ideal Body Weight: Weight in (lb) to have BMI = 25: 176.4   GEN: WDWN, NAD, Non-toxic, Alert  & Oriented x 3 HEENT: Atraumatic, Normocephalic.  Ears and Nose: No external deformity. EXTR: No clubbing/cyanosis/edema NEURO: Normal gait.  PSYCH: Normally interactive. Conversant. Not depressed or anxious appearing.  Calm demeanor.  LEFT knee tender medially.  Little effusion.  Joint stable  Positive mcmurray test suggesting medial meniscus injury  Assessment and Plan: Meniscus tear Follow up one week Crutches Knee immob Anaprox  Signed,  Phillips OdorJeffery Estefano Victory, MD

## 2014-04-17 NOTE — Patient Instructions (Signed)
Knee, Cartilage (Meniscus) Injury  It is suspected that you have a torn cartilage (meniscus) in your knee. The menisci are made of tough cartilage and fit between the surfaces of the thigh and leg bones. The menisci are C-shaped and have a wedged profile. The wedged profile helps the stability of the joint by keeping the rounded femur surface from sliding off the flat tibial surface. The menisci are fed (nourished) by small blood vessels, but there is also a large area at the inner edge of the meniscus that does not have a good blood supply (avascular). This presents a problem when there is an injury to the meniscus because areas without good blood supply heal poorly. As a result when there is a torn cartilage in the knee, surgery is often required to fix it. This is usually done with a surgical procedure less invasive than open surgery (arthroscopy). Some times open surgery of the knee is required if there is other damage.  PURPOSE OF THE MENISCUS  The medial meniscus rests on the medial tibial plateau. The tibia is the large bone in your lower leg (the shin bone). The medial tibial plateau is the upper end of the bone making up the inner part of your knee. The lateral meniscus serves the same purpose and is located on the outside of the knee. The menisci help to distribute your body weight across the knee joint; they act as shock absorbers. Without the meniscus present, the weight of your body would be unevenly applied to the bones in your legs (the femur and tibia). The femur is the large bone in your thigh. This uneven weight distribution would cause increased wear and tear on the cartilage lining the joint surfaces, leading to early damage (arthritis) of these areas. The presence of the menisci cartilage is necessary for a healthy knee.  PURPOSE OF THE KNEE CARTILAGE  The knee joint is made up of three bones: the thigh bone (femur), the shin bone (tibia), and the knee cap (patella). The surfaces of these bones  at the knee joint are covered with cartilage called articular cartilage. This smooth, slippery surface allows the bones to slide against each other without causing bone damage. The meniscus sits between these cartilaginous surfaces of the bones. It distributes the weight evenly in the joints and helps with the stability of the joint (keeps the joint steady).  HOME CARE INSTRUCTIONS  · Use crutches and external braces as instructed.  · Once home, an ice pack applied to your injured knee may help with discomfort and keep the swelling down. An ice pack can be used for the first couple of days or as instructed.  · Only take over-the-counter or prescription medicines for pain, discomfort, or fever as directed by your caregiver.  · Call if you do not have relief of pain with medications or if there is increasing in pain.  · Call if your foot becomes cold or blue.  · You may resume normal diet and activities as directed.  · Make sure to keep your appointments with your follow-up caregiver. This injury may require further evaluation and treatment beyond the temporary treatment given today.  Document Released: 06/28/2002 Document Revised: 08/22/2013 Document Reviewed: 10/20/2008  ExitCare® Patient Information ©2015 [employer]. This information is not intended to replace advice given to you by your health care provider. Make sure you discuss any questions you have with your health care provider.

## 2015-01-09 ENCOUNTER — Ambulatory Visit (INDEPENDENT_AMBULATORY_CARE_PROVIDER_SITE_OTHER): Payer: Self-pay | Admitting: Family Medicine

## 2015-01-09 ENCOUNTER — Ambulatory Visit (INDEPENDENT_AMBULATORY_CARE_PROVIDER_SITE_OTHER): Payer: BLUE CROSS/BLUE SHIELD

## 2015-01-09 VITALS — BP 134/95 | HR 85 | Temp 98.3°F | Resp 17 | Ht 70.5 in | Wt 283.0 lb

## 2015-01-09 DIAGNOSIS — R319 Hematuria, unspecified: Secondary | ICD-10-CM | POA: Diagnosis not present

## 2015-01-09 DIAGNOSIS — R109 Unspecified abdominal pain: Secondary | ICD-10-CM

## 2015-01-09 LAB — POCT URINALYSIS DIP (MANUAL ENTRY)
BILIRUBIN UA: NEGATIVE
BILIRUBIN UA: NEGATIVE
GLUCOSE UA: NEGATIVE
Leukocytes, UA: NEGATIVE
Nitrite, UA: NEGATIVE
Protein Ur, POC: NEGATIVE
Urobilinogen, UA: 0.2
pH, UA: 6

## 2015-01-09 LAB — POCT CBC
Granulocyte percent: 55.1 %G (ref 37–80)
HCT, POC: 45.6 % (ref 43.5–53.7)
HEMOGLOBIN: 14.5 g/dL (ref 14.1–18.1)
LYMPH, POC: 4.8 — AB (ref 0.6–3.4)
MCH: 27.9 pg (ref 27–31.2)
MCHC: 31.9 g/dL (ref 31.8–35.4)
MCV: 87.5 fL (ref 80–97)
MID (cbc): 0.4 (ref 0–0.9)
MPV: 9.8 fL (ref 0–99.8)
PLATELET COUNT, POC: 263 10*3/uL (ref 142–424)
POC Granulocyte: 6.5 (ref 2–6.9)
POC LYMPH PERCENT: 41.1 %L (ref 10–50)
POC MID %: 3.8 % (ref 0–12)
RBC: 5.21 M/uL (ref 4.69–6.13)
RDW, POC: 12.7 %
WBC: 11.8 10*3/uL — AB (ref 4.6–10.2)

## 2015-01-09 LAB — POC MICROSCOPIC URINALYSIS (UMFC)

## 2015-01-09 MED ORDER — TAMSULOSIN HCL 0.4 MG PO CAPS: 0.4000 mg | ORAL_CAPSULE | Freq: Every day | ORAL | Status: DC

## 2015-01-09 MED ORDER — KETOROLAC TROMETHAMINE 10 MG PO TABS: 10.0000 mg | ORAL_TABLET | Freq: Four times a day (QID) | ORAL | Status: DC | PRN

## 2015-01-09 MED ORDER — TAMSULOSIN HCL 0.4 MG PO CAPS: 0.4000 mg | ORAL_CAPSULE | Freq: Every day | ORAL | Status: AC

## 2015-01-09 MED ORDER — CEPHALEXIN 500 MG PO CAPS: 500.0000 mg | ORAL_CAPSULE | Freq: Two times a day (BID) | ORAL | Status: DC

## 2015-01-09 NOTE — Progress Notes (Signed)
[facility] [address], [place] [address] [phone]  Date:  01/09/2015   Name:  Jonathan Russell   DOB:  12-03-1985   MRN:  [id number]  PCP:  No PCP Per Patient    History of Present Illness:  Jonathan Russell is a 29 y.o. male patient who presents to [facility] for cc of flank pain for about 3 days.  He feels tight at upper body on both sides but more prominent at the right upper side.  It starts at the sides of his back and wraps around to the flank.  It is a constant dull ache.  He has noticed decreased appetite.  He has noticed some constipation in the last week.  He denies any trauma, or positional changes in bed.  He has no dysuria, hematuria, or frequency to note.  He has no nausea, vomiting, or dizziness.  He has no fever.    Patient Active Problem List   Diagnosis Date Noted  . Atrial fibrillation 01/28/2013  . Nicotine addiction 09/20/2012  . BMI 37.0-37.9, adult 09/20/2012    No past medical history on file.  Past Surgical History  Procedure Laterality Date  . Hip surgery      Social History  Substance Use Topics  . Smoking status: Former Smoker -- 1.00 packs/day    Types: Cigarettes    Quit date: 11/08/2014  . Smokeless tobacco: None  . Alcohol Use: 0.0 oz/week    0 Standard drinks or equivalent per week    Family History  Problem Relation Age of Onset  . Dysrhythmia Mother     Allergies  Allergen Reactions  . Codeine Nausea Only    Medication list has been reviewed and updated.  No current outpatient prescriptions on file prior to visit.   No current facility-administered medications on file prior to visit.    ROS ROS otherwise unremarkable unless listed above.  Physical Examination: BP 144/108 mmHg  Pulse 88  Temp(Src) 98.3 F (36.8 C) (Oral)  Resp 17  Ht 5' 10.5" (1.791 m)  Wt 283 lb (128.368 kg)  BMI 40.02 kg/m2  SpO2 98% Ideal Body Weight: Weight in (lb) to have BMI = 25: 176.4  Physical Exam   Constitutional: He is oriented to person, place, and time. He appears well-developed and well-nourished. No distress.  HENT:  Head: Normocephalic and atraumatic.  Eyes: Conjunctivae and EOM are normal. Pupils are equal, round, and reactive to light.  Cardiovascular: Normal rate, regular rhythm and normal heart sounds.  Exam reveals no gallop, no distant heart sounds and no friction rub.   No murmur heard. Pulses:      Carotid pulses are 2+ on the right side, and 2+ on the left side.      Radial pulses are 2+ on the right side, and 2+ on the left side.       Dorsalis pedis pulses are 2+ on the right side, and 2+ on the left side.  Pulmonary/Chest: Effort normal. No respiratory distress.  Abdominal: Normal appearance and bowel sounds are normal. There is no splenomegaly or hepatomegaly. There is CVA tenderness. There is no guarding, no tenderness at McBurney's point and negative Murphy's sign.  Neurological: He is alert and oriented to person, place, and time.  Skin: Skin is warm and dry. He is not diaphoretic.  Psychiatric: He has a normal mood and affect. His behavior is normal.    Results for orders placed or performed in visit on 01/09/15  POCT CBC  Result Value Ref Range   WBC 11.8 (A) 4.6 - 10.2 K/uL   Lymph, poc 4.8 (A) 0.6 - 3.4   POC LYMPH PERCENT 41.1 10 - 50 %L   MID (cbc) 0.4 0 - 0.9   POC MID % 3.8 0 - 12 %M   POC Granulocyte 6.5 2 - 6.9   Granulocyte percent 55.1 37 - 80 %G   RBC 5.21 4.69 - 6.13 M/uL   Hemoglobin 14.5 14.1 - 18.1 g/dL   HCT, POC 16.1 09.6 - 53.7 %   MCV 87.5 80 - 97 fL   MCH, POC 27.9 27 - 31.2 pg   MCHC 31.9 31.8 - 35.4 g/dL   RDW, POC 04.5 %   Platelet Count, POC 263 142 - 424 K/uL   MPV 9.8 0 - 99.8 fL  POCT Microscopic Urinalysis ([facility])  Result Value Ref Range   WBC,UR,HPF,POC Few (A) None WBC/hpf   RBC,UR,HPF,POC Few (A) None RBC/hpf   Bacteria None None   Mucus Present (A) Absent   Epithelial Cells, UR Per Microscopy None None cells/hpf   POCT urinalysis dipstick  Result Value Ref Range   Color, UA yellow yellow   Clarity, UA clear clear   Glucose, UA negative negative   Bilirubin, UA negative negative   Ketones, POC UA negative negative   Spec Grav, UA >=1.030    Blood, UA trace-intact (A) negative   pH, UA 6.0    Protein Ur, POC negative negative   Urobilinogen, UA 0.2    Nitrite, UA Negative Negative   Leukocytes, UA Negative Negative   [facility] reading (PRIMARY) by  Dr. Conley Rolls: Moderate stool burden.  Questionable right ureteral stone vs phlebolith  Assessment and Plan: 29 year old male is here for flank pain.  Diff dx: kidney stone, uti, constipation. -Starting him on toradol to see if this improves his pain. -Advised maximum hydration, and given flomax.  Advised to use a strainer. -We will also treat him for a uti at this time.  Elevated white count.   -CMET and lipase at this time.    Flank pain - Plan: POCT CBC, COMPLETE METABOLIC PANEL WITH GFR, POCT Microscopic Urinalysis ([facility]), POCT urinalysis dipstick, Lipase, DG Abd Acute W/Chest, ketorolac (TORADOL) 10 MG tablet, tamsulosin (FLOMAX) 0.4 MG CAPS capsule, DISCONTINUED: tamsulosin (FLOMAX) 0.4 MG CAPS capsule, DISCONTINUED: ketorolac (TORADOL) 10 MG tablet  Hematuria - Plan: cephALEXin (KEFLEX) 500 MG capsule, DISCONTINUED: cephALEXin (KEFLEX) 500 MG capsule   Trena Platt, PA-C [facility] 01/09/2015 6:44 PM

## 2015-01-09 NOTE — Patient Instructions (Signed)
Please hydrate as much as possible, with over 64 oz per day.  This could be a stone and we want to flush you out. Keflex as prescribed.   Let us know if you are not having improvement over the next 5 days.    Tamsulosin capsules What is this medicine? TAMSULOSIN (tam SOO loe sin) is used to treat enlargement of the prostate gland in men, a condition called benign prostatic hyperplasia or BPH. It is not for use in women. It works by relaxing muscles in the prostate and bladder neck. This improves urine flow and reduces BPH symptoms. This medicine may be used for other purposes; ask your health care provider or pharmacist if you have questions. COMMON BRAND NAME(S): Flomax What should I tell my health care provider before I take this medicine? They need to know if you have any of the following conditions: -advanced kidney disease -advanced liver disease -low blood pressure -prostate cancer -an unusual or allergic reaction to tamsulosin, sulfa drugs, other medicines, foods, dyes, or preservatives -pregnant or trying to get pregnant -breast-feeding How should I use this medicine? Take this medicine by mouth about 30 minutes after the same meal every day. Follow the directions on the prescription label. Swallow the capsules whole with a glass of water. Do not crush, chew, or open capsules. Do not take your medicine more often than directed. Do not stop taking your medicine unless your doctor tells you to. Talk to your pediatrician regarding the use of this medicine in children. Special care may be needed. Overdosage: If you think you have taken too much of this medicine contact a poison control center or emergency room at once. NOTE: This medicine is only for you. Do not share this medicine with others. What if I miss a dose? If you miss a dose, take it as soon as you can. If it is almost time for your next dose, take only that dose. Do not take double or extra doses. If you stop taking your  medicine for several days or more, ask your doctor or health care professional what dose you should start back on. What may interact with this medicine? -cimetidine -fluoxetine -ketoconazole -medicines for erectile disfunction like sildenafil, tadalafil, vardenafil -medicines for high blood pressure -other alpha-blockers like alfuzosin, doxazosin, phentolamine, phenoxybenzamine, prazosin, terazosin -warfarin This list may not describe all possible interactions. Give your health care provider a list of all the medicines, herbs, non-prescription drugs, or dietary supplements you use. Also tell them if you smoke, drink alcohol, or use illegal drugs. Some items may interact with your medicine. What should I watch for while using this medicine? Visit your doctor or health care professional for regular check ups. You will need lab work done before you start this medicine and regularly while you are taking it. Check your blood pressure as directed. Ask your health care professional what your blood pressure should be, and when you should contact him or her. This medicine may make you feel dizzy or lightheaded. This is more likely to happen after the first dose, after an increase in dose, or during hot weather or exercise. Drinking alcohol and taking some medicines can make this worse. Do not drive, use machinery, or do anything that needs mental alertness until you know how this medicine affects you. Do not sit or stand up quickly. If you begin to feel dizzy, sit down until you feel better. These effects can decrease once your body adjusts to the medicine. Contact your doctor or  health care professional right away if you have an erection that lasts longer than 4 hours or if it becomes painful. This may be a sign of a serious problem and must be treated right away to prevent permanent damage. If you are thinking of having cataract surgery, tell your eye surgeon that you have taken this medicine. What side  effects may I notice from receiving this medicine? Side effects that you should report to your doctor or health care professional as soon as possible: -allergic reactions like skin rash or itching, hives, swelling of the lips, mouth, tongue, or throat -breathing problems -change in vision -feeling faint or lightheaded -irregular heartbeat -prolonged or painful erection -weakness Side effects that usually do not require medical attention (report to your doctor or health care professional if they continue or are bothersome): -back pain -change in sex drive or performance -constipation, nausea or vomiting -cough -drowsy -runny or stuffy nose -trouble sleeping This list may not describe all possible side effects. Call your doctor for medical advice about side effects. You may report side effects to FDA at 1-800-FDA-1088. Where should I keep my medicine? Keep out of the reach of children. Store at room temperature between 15 and 30 degrees C (59 and 86 degrees F). Throw away any unused medicine after the expiration date. NOTE: This sheet is a summary. It may not cover all possible information. If you have questions about this medicine, talk to your doctor, pharmacist, or health care provider.  2015, Elsevier/Gold Standard. (2012-04-07 14:11:34)

## 2015-01-10 LAB — COMPLETE METABOLIC PANEL WITH GFR
ALT: 20 U/L (ref 9–46)
AST: 17 U/L (ref 10–40)
Albumin: 4.9 g/dL (ref 3.6–5.1)
Alkaline Phosphatase: 76 U/L (ref 40–115)
BUN: 15 mg/dL (ref 7–25)
CALCIUM: 9.6 mg/dL (ref 8.6–10.3)
CHLORIDE: 102 mmol/L (ref 98–110)
CO2: 27 mmol/L (ref 20–31)
Creat: 0.99 mg/dL (ref 0.60–1.35)
GFR, Est African American: >89 mL/min (ref >=60)
GFR, Est Non African American: >89 mL/min (ref >=60)
GLUCOSE: 75 mg/dL (ref 65–99)
POTASSIUM: 4.5 mmol/L (ref 3.5–5.3)
SODIUM: 140 mmol/L (ref 135–146)
Total Bilirubin: 0.3 mg/dL (ref 0.2–1.2)
Total Protein: 7.6 g/dL (ref 6.1–8.1)

## 2015-01-10 LAB — LIPASE: LIPASE: 30 U/L (ref 7–60)

## 2015-01-11 NOTE — Progress Notes (Signed)
Agree with A/P. Dr Le 

## 2015-01-16 ENCOUNTER — Telehealth: Payer: Self-pay

## 2015-01-16 NOTE — Telephone Encounter (Signed)
Patient called requesting lab results. He states "it's been a week today and nobody told me anything" It doesn't look like Dr. Conley Rolls has reviewed labs. Can labs be reviewed and call patient? He sounded mad on the phone about results not being ready. Cb# [phone].

## 2015-02-14 ENCOUNTER — Ambulatory Visit (INDEPENDENT_AMBULATORY_CARE_PROVIDER_SITE_OTHER): Payer: BLUE CROSS/BLUE SHIELD | Admitting: Emergency Medicine

## 2015-02-14 VITALS — BP 142/82 | HR 90 | Temp 98.2°F | Resp 16 | Ht 69.5 in | Wt 281.6 lb

## 2015-02-14 DIAGNOSIS — R109 Unspecified abdominal pain: Secondary | ICD-10-CM

## 2015-02-14 DIAGNOSIS — S39012D Strain of muscle, fascia and tendon of lower back, subsequent encounter: Secondary | ICD-10-CM

## 2015-02-14 MED ORDER — NAPROXEN SODIUM 550 MG PO TABS: 550.0000 mg | ORAL_TABLET | Freq: Two times a day (BID) | ORAL | Status: DC

## 2015-02-14 MED ORDER — HYDROCODONE-ACETAMINOPHEN 5-325 MG PO TABS: 1.0000 | ORAL_TABLET | ORAL | Status: DC | PRN

## 2015-02-14 NOTE — Progress Notes (Signed)
Subjective:  Patient ID: Jonathan Russell, male    DOB: 08-14-1985  Age: 29 y.o. MRN: [id number]  CC: Flank Pain   HPI Jonathan Russell presents  with pain in his right low back on the flank. He works in [employer] does a lot of lifting and crawling around and bending is been seen previously and told that he might have a kidney stone in his urology visit was negative. He continues to have pain that isn't radiating. He has no numbness tingling or weakness in his leg. He has no stridor direct injury. No GI or GU symptoms no fever chills  History Jonathan Russell has no past medical history on file.   He has past surgical history that includes Hip surgery.   His  family history includes Dysrhythmia in his mother.  He   reports that he quit smoking about 3 months ago. His smoking use included Cigarettes. He smoked 1.00 pack per day. He does not have any smokeless tobacco history on file. He reports that he drinks alcohol. He reports that he does not use illicit drugs.  Outpatient Prescriptions Prior to Visit  Medication Sig Dispense Refill  . cephALEXin (KEFLEX) 500 MG capsule Take 1 capsule (500 mg total) by mouth 2 (two) times daily. (Patient not taking: Reported on 02/14/2015) 10 capsule 0  . ketorolac (TORADOL) 10 MG tablet Take 1 tablet (10 mg total) by mouth every 6 (six) hours as needed. (Patient not taking: Reported on 02/14/2015) 20 tablet 0   No facility-administered medications prior to visit.    Social History   Social History  . Marital Status: Single    Spouse Name: N/A  . Number of Children: N/A  . Years of Education: N/A   Social History Main Topics  . Smoking status: Former Smoker -- 1.00 packs/day    Types: Cigarettes    Quit date: 11/08/2014  . Smokeless tobacco: None  . Alcohol Use: 0.0 oz/week    0 Standard drinks or equivalent per week  . Drug Use: No  . Sexual Activity: Not Asked   Other Topics Concern  . None   Social History Narrative      Review of Systems  Constitutional: Negative for fever, chills and appetite change.  HENT: Negative for congestion, ear pain, postnasal drip, sinus pressure and sore throat.   Eyes: Negative for pain and redness.  Respiratory: Negative for cough, shortness of breath and wheezing.   Cardiovascular: Negative for leg swelling.  Gastrointestinal: Negative for nausea, vomiting, abdominal pain, diarrhea, constipation and blood in stool.  Endocrine: Negative for polyuria.  Genitourinary: Negative for dysuria, urgency, frequency and flank pain.  Musculoskeletal: Negative for gait problem.  Skin: Negative for rash.  Neurological: Negative for weakness and headaches.  Psychiatric/Behavioral: Negative for confusion and decreased concentration. The patient is not nervous/anxious.     Objective:  BP 142/82 mmHg  Pulse 90  Temp(Src) 98.2 F (36.8 C) (Oral)  Resp 16  Ht 5' 9.5" (1.765 m)  Wt 281 lb 9.6 oz (127.733 kg)  BMI 41.00 kg/m2  SpO2 98%  Physical Exam  Constitutional: He is oriented to person, place, and time. He appears well-developed and well-nourished.  HENT:  Head: Normocephalic and atraumatic.  Eyes: Conjunctivae are normal. Pupils are equal, round, and reactive to light.  Pulmonary/Chest: Effort normal.  Musculoskeletal: He exhibits no edema.       Lumbar back: He exhibits tenderness (He has point tenderness on the right iliac crest at the quadratus muscle and  insertion).  Neurological: He is alert and oriented to person, place, and time.  Skin: Skin is dry.  Psychiatric: He has a normal mood and affect. His behavior is normal. Thought content normal.      Assessment & Plan:   Jonathan Brownsnthony was seen today for flank pain.  Diagnoses and all orders for this visit:  Flank pain  Back strain, subsequent encounter -     Ambulatory referral to Orthopedic Surgery  Other orders -     naproxen sodium (ANAPROX DS) 550 MG tablet; Take 1 tablet (550 mg total) by mouth 2 (two)  times daily with a meal. -     HYDROcodone-acetaminophen (NORCO) 5-325 MG tablet; Take 1-2 tablets by mouth every 4 (four) hours as needed.   I am having Jonathan Russell start on naproxen sodium and HYDROcodone-acetaminophen. I am also having him maintain his cephALEXin and ketorolac.  Meds ordered this encounter  Medications  . naproxen sodium (ANAPROX DS) 550 MG tablet    Sig: Take 1 tablet (550 mg total) by mouth 2 (two) times daily with a meal.    Dispense:  60 tablet    Refill:  0  . HYDROcodone-acetaminophen (NORCO) 5-325 MG tablet    Sig: Take 1-2 tablets by mouth every 4 (four) hours as needed.    Dispense:  30 tablet    Refill:  0    Appropriate red flag conditions were discussed with the patient as well as actions that should be taken.  Patient expressed his understanding.  Follow-up: Return if symptoms worsen or fail to improve.  Carmelina DaneAnderson, Marieme Mcmackin S, MD

## 2015-02-14 NOTE — Patient Instructions (Signed)

## 2015-02-15 ENCOUNTER — Telehealth: Payer: Self-pay

## 2015-02-15 NOTE — Telephone Encounter (Signed)
Please see previous message

## 2015-02-15 NOTE — Telephone Encounter (Signed)
Patient would like to pick up x-ray copies today around 5. The x-rays taken were of his chest, back, and stomach, taken about a month ago.

## 2015-02-19 NOTE — Telephone Encounter (Signed)
At the patient's request, I made copy of his x-ray on CD, and placed in the pick up drawer.

## 2015-04-21 ENCOUNTER — Ambulatory Visit (INDEPENDENT_AMBULATORY_CARE_PROVIDER_SITE_OTHER): Payer: BLUE CROSS/BLUE SHIELD | Admitting: Physician Assistant

## 2015-04-21 VITALS — BP 138/80 | HR 94 | Temp 98.1°F | Resp 18 | Ht 70.5 in | Wt 277.7 lb

## 2015-04-21 DIAGNOSIS — J069 Acute upper respiratory infection, unspecified: Secondary | ICD-10-CM | POA: Diagnosis not present

## 2015-04-21 DIAGNOSIS — B9789 Other viral agents as the cause of diseases classified elsewhere: Principal | ICD-10-CM

## 2015-04-21 MED ORDER — HYDROCOD POLST-CPM POLST ER 10-8 MG/5ML PO SUER: 5.0000 mL | Freq: Every evening | ORAL | Status: DC | PRN

## 2015-04-21 MED ORDER — GUAIFENESIN ER 1200 MG PO TB12: 1.0000 | ORAL_TABLET | Freq: Two times a day (BID) | ORAL | Status: DC | PRN

## 2015-04-21 NOTE — Patient Instructions (Signed)
Upper Respiratory Infection, Adult Most upper respiratory infections (URIs) are a viral infection of the air passages leading to the lungs. A URI affects the nose, throat, and upper air passages. The most common type of URI is nasopharyngitis and is typically referred to as "the common cold." URIs run their course and usually go away on their own. Most of the time, a URI does not require medical attention, but sometimes a bacterial infection in the upper airways can follow a viral infection. This is called a secondary infection. Sinus and middle ear infections are common types of secondary upper respiratory infections. Bacterial pneumonia can also complicate a URI. A URI can worsen asthma and chronic obstructive pulmonary disease (COPD). Sometimes, these complications can require emergency medical care and may be life threatening.  CAUSES Almost all URIs are caused by viruses. A virus is a type of germ and can spread from one person to another.  RISKS FACTORS You may be at risk for a URI if:   You smoke.   You have chronic heart or lung disease.  You have a weakened defense (immune) system.   You are very young or very old.   You have nasal allergies or asthma.  You work in crowded or poorly ventilated areas.  You work in health care facilities or schools. SIGNS AND SYMPTOMS  Symptoms typically develop 2-3 days after you come in contact with a cold virus. Most viral URIs last 7-10 days. However, viral URIs from the influenza virus (flu virus) can last 14-18 days and are typically more severe. Symptoms may include:   Runny or stuffy (congested) nose.   Sneezing.   Cough.   Sore throat.   Headache.   Fatigue.   Fever.   Loss of appetite.   Pain in your forehead, behind your eyes, and over your cheekbones (sinus pain).  Muscle aches.  DIAGNOSIS  Your health care provider may diagnose a URI by:  Physical exam.  Tests to check that your symptoms are not due to  another condition such as:  Strep throat.  Sinusitis.  Pneumonia.  Asthma. TREATMENT  A URI goes away on its own with time. It cannot be cured with medicines, but medicines may be prescribed or recommended to relieve symptoms. Medicines may help:  Reduce your fever.  Reduce your cough.  Relieve nasal congestion. HOME CARE INSTRUCTIONS   Take medicines only as directed by your health care provider.   Gargle warm saltwater or take cough drops to comfort your throat as directed by your health care provider.  Use a warm mist humidifier or inhale steam from a shower to increase air moisture. This may make it easier to breathe.  Drink enough fluid to keep your urine clear or pale yellow.   Eat soups and other clear broths and maintain good nutrition.   Rest as needed.   Return to work when your temperature has returned to normal or as your health care provider advises. You may need to stay home longer to avoid infecting others. You can also use a face mask and careful hand washing to prevent spread of the virus.  Increase the usage of your inhaler if you have asthma.   Do not use any tobacco products, including cigarettes, chewing tobacco, or electronic cigarettes. If you need help quitting, ask your health care provider. PREVENTION  The best way to protect yourself from getting a cold is to practice good hygiene.   Avoid oral or hand contact with people with cold   symptoms.   Wash your hands often if contact occurs.  There is no clear evidence that vitamin C, vitamin E, echinacea, or exercise reduces the chance of developing a cold. However, it is always recommended to get plenty of rest, exercise, and practice good nutrition.  SEEK MEDICAL CARE IF:   You are getting worse rather than better.   Your symptoms are not controlled by medicine.   You have chills.  You have worsening shortness of breath.  You have brown or red mucus.  You have yellow or brown nasal  discharge.  You have pain in your face, especially when you bend forward.  You have a fever.  You have swollen neck glands.  You have pain while swallowing.  You have white areas in the back of your throat. SEEK IMMEDIATE MEDICAL CARE IF:   You have severe or persistent:  Headache.  Ear pain.  Sinus pain.  Chest pain.  You have chronic lung disease and any of the following:  Wheezing.  Prolonged cough.  Coughing up blood.  A change in your usual mucus.  You have a stiff neck.  You have changes in your:  Vision.  Hearing.  Thinking.  Mood. MAKE SURE YOU:   Understand these instructions.  Will watch your condition.  Will get help right away if you are not doing well or get worse.   This information is not intended to replace advice given to you by your health care provider. Make sure you discuss any questions you have with your health care provider.   Document Released: 10/01/2000 Document Revised: 08/22/2014 Document Reviewed: 07/13/2013 Elsevier Interactive Patient Education 2016 Elsevier Inc.  

## 2015-04-21 NOTE — Progress Notes (Signed)
[facility] [address], [place] [address] [phone]  Date:  04/21/2015   Name:  Jonathan Russell   DOB:  04/13/1986   MRN:  [id number]  PCP:  No PCP Per Patient    History of Present Illness:  Jonathan Russell is a 29 y.o. male patient who presents to [facility] for cc of cough. This has been for 3 days.  Feels fatigued.  Nasal congestion with post nasal drip.  He is coughing with productive yellow green sputum, moreso in the morning, and at night.  Felt as if he had dyspnea, until he cleared his trhoat.    No fever or sorethroat.   No GI complaints reported.    Patient Active Problem List   Diagnosis Date Noted  . Atrial fibrillation (HCC) 01/28/2013  . Nicotine addiction 09/20/2012  . BMI 37.0-37.9, adult 09/20/2012    History reviewed. No pertinent past medical history.  Past Surgical History  Procedure Laterality Date  . Hip surgery      Social History  Substance Use Topics  . Smoking status: Former Smoker -- 1.00 packs/day    Types: Cigarettes    Quit date: 11/08/2014  . Smokeless tobacco: None  . Alcohol Use: 0.0 oz/week    0 Standard drinks or equivalent per week    Family History  Problem Relation Age of Onset  . Dysrhythmia Mother     Allergies  Allergen Reactions  . Codeine Nausea Only    Medication list has been reviewed and updated.  Current Outpatient Prescriptions on File Prior to Visit  Medication Sig Dispense Refill  . cephALEXin (KEFLEX) 500 MG capsule Take 1 capsule (500 mg total) by mouth 2 (two) times daily. (Patient not taking: Reported on 02/14/2015) 10 capsule 0  . HYDROcodone-acetaminophen (NORCO) 5-325 MG tablet Take 1-2 tablets by mouth every 4 (four) hours as needed. (Patient not taking: Reported on 04/21/2015) 30 tablet 0  . ketorolac (TORADOL) 10 MG tablet Take 1 tablet (10 mg total) by mouth every 6 (six) hours as needed. (Patient not taking: Reported on 02/14/2015) 20 tablet 0  . naproxen sodium  (ANAPROX DS) 550 MG tablet Take 1 tablet (550 mg total) by mouth 2 (two) times daily with a meal. (Patient not taking: Reported on 04/21/2015) 60 tablet 0   No current facility-administered medications on file prior to visit.    ROS ROS otherwise unremarkable unless listed above.   Physical Examination: BP 138/80 mmHg  Pulse 94  Temp(Src) 98.1 F (36.7 C) (Oral)  Resp 18  Ht 5' 10.5" (1.791 m)  Wt 277 lb 11.2 oz (125.964 kg)  BMI 39.27 kg/m2  SpO2 98% Ideal Body Weight: Weight in (lb) to have BMI = 25: 176.4  Physical Exam  Constitutional: He is oriented to person, place, and time. He appears well-developed and well-nourished. No distress.  HENT:  Head: Normocephalic and atraumatic.  Right Ear: Tympanic membrane, external ear and ear canal normal.  Left Ear: Tympanic membrane, external ear and ear canal normal.  Nose: Mucosal edema (right nostril>) and rhinorrhea present. Right sinus exhibits no maxillary sinus tenderness and no frontal sinus tenderness. Left sinus exhibits no maxillary sinus tenderness and no frontal sinus tenderness.  Eyes: Conjunctivae and EOM are normal. Pupils are equal, round, and reactive to light.  Cardiovascular: Normal rate.   Pulmonary/Chest: Effort normal. No respiratory distress. He has no decreased breath sounds. He has no wheezes. He has no rhonchi.  Neurological: He is alert  and oriented to person, place, and time.  Skin: Skin is warm and dry. He is not diaphoretic.  Psychiatric: He has a normal mood and affect. His behavior is normal.     Assessment and Plan: Carlynn Sprynthony R Hedges is a 29 y.o. male who is here today for cough and nasal congestion for 3 days. Viral uri.  Treating supportively.  Advised to contact in 4 days if no improvement, where abx can be discussed. .  Viral URI with cough - Plan: chlorpheniramine-HYDROcodone (TUSSIONEX PENNKINETIC ER) 10-8 MG/5ML SUER, Guaifenesin (MUCINEX MAXIMUM STRENGTH) 1200 MG TB12  Trena PlattStephanie Cherylanne Ardelean,  PA-C [facility] 04/21/2015 9:51 AM

## 2015-04-22 ENCOUNTER — Encounter: Payer: Self-pay | Admitting: Physician Assistant

## 2015-05-09 ENCOUNTER — Ambulatory Visit (INDEPENDENT_AMBULATORY_CARE_PROVIDER_SITE_OTHER): Payer: BLUE CROSS/BLUE SHIELD | Admitting: Urgent Care

## 2015-05-09 VITALS — BP 124/82 | HR 87 | Temp 98.1°F | Resp 16 | Ht 70.0 in | Wt 273.0 lb

## 2015-05-09 DIAGNOSIS — S61412A Laceration without foreign body of left hand, initial encounter: Secondary | ICD-10-CM

## 2015-05-09 NOTE — Progress Notes (Signed)
    MRN: [id number] DOB: 11-Apr-1986  Subjective:   Jonathan Russell is a 30 y.o. male presenting for chief complaint of Laceration  Reports laceration to his left hand today. Patient was working in his yard, tripped and fell with his hand outstretched, landed on a metal object. He bled profusely, used iodine, wrapped his hand in gauze and came to the clinic. His last TDAP was 04/13/2012. He admits pain over wound. He denies decreased ROM, numbness or tingling.   Jonathan Russell currently has no medications in their medication list. Also is allergic to codeine.  Jonathan Russell  has no past medical history on file. Also  has past surgical history that includes Hip surgery.  Objective:   Vitals: BP 124/82 mmHg  Pulse 87  Temp(Src) 98.1 F (36.7 C)  Resp 16  Ht  (1.778 m)  Wt 273 lb (123.832 kg)  BMI 39.17 kg/m2  SpO2 98%  Physical Exam  Constitutional: He is oriented to person, place, and time. He appears well-developed and well-nourished.  Cardiovascular: Normal rate.   Pulmonary/Chest: Effort normal.  Musculoskeletal:       Hands: Neurological: He is alert and oriented to person, place, and time.  Skin: Skin is warm and dry.   PROCEDURE NOTE: laceration repair Verbal consent obtained from patient to have laceration repair by PA-S Jonathan Needle under PA-Jonathan Russell's direct supervision. Local anesthesia with 4cc Lidocaine 2% without epinephrine. Wound explored for tendon, ligament damage. Wound scrubbed with soap and water and rinsed. Wound closed with #2 4-0 Prolene 1 simple interrupted and 1 horizontal mattress sutures.  Wound cleansed and dressed.  Assessment and Plan :   1. Hand laceration, left, initial encounter - Stable, wound care reviewed. Patient to rtc in 7-10 days for suture removal.  Jonathan Bamberg, PA-C [facility] [phone] 05/09/2015 12:45 PM

## 2015-05-09 NOTE — Patient Instructions (Signed)

## 2015-05-17 ENCOUNTER — Ambulatory Visit (INDEPENDENT_AMBULATORY_CARE_PROVIDER_SITE_OTHER): Payer: BLUE CROSS/BLUE SHIELD | Admitting: Physician Assistant

## 2015-05-17 VITALS — BP 132/80 | HR 83 | Temp 98.5°F | Resp 18

## 2015-05-17 DIAGNOSIS — S61412D Laceration without foreign body of left hand, subsequent encounter: Secondary | ICD-10-CM

## 2015-05-17 NOTE — Progress Notes (Signed)
[facility] [address], [place] [address] [phone]  Date:  05/17/2015   Name:  Jonathan Russell   DOB:  [birth year]   MRN:  [id number]  PCP:  No PCP Per Patient    Chief Complaint: Suture / Staple Removal   History of Present Illness:  This is a 30 y.o. male who is presenting for suture removal. He was seen here 05/09/15 with left hand laceration. Wound repaired with #2 sutures. He reports no problems. No significant pain, erythema, drainage, fever, chills.  Review of Systems:  Review of Systems See HPI  Patient Active Problem List   Diagnosis Date Noted  . Atrial fibrillation (HCC) 01/28/2013  . Nicotine addiction 09/20/2012  . BMI 37.0-37.9, adult 09/20/2012    Prior to Admission medications   Not on File    Allergies  Allergen Reactions  . Codeine Nausea Only    Past Surgical History  Procedure Laterality Date  . Hip surgery      Social History  Substance Use Topics  . Smoking status: Current Every Day Smoker -- 1.00 packs/day    Types: Cigarettes    Last Attempt to Quit: 11/08/2014  . Smokeless tobacco: None  . Alcohol Use: 0.0 oz/week    0 Standard drinks or equivalent per week    Family History  Problem Relation Age of Onset  . Dysrhythmia Mother     Medication list has been reviewed and updated.  Physical Examination:  Physical Exam  Constitutional: He is oriented to person, place, and time. He appears well-developed and well-nourished. No distress.  HENT:  Head: Normocephalic and atraumatic.  Right Ear: Hearing normal.  Left Ear: Hearing normal.  Nose: Nose normal.  Eyes: Conjunctivae and lids are normal. Right eye exhibits no discharge. Left eye exhibits no discharge. No scleral icterus.  Pulmonary/Chest: Effort normal. No respiratory distress.  Musculoskeletal: Normal range of motion.  Neurological: He is alert and oriented to person, place, and time.  Skin: Skin is warm, dry and intact.  Left hand  laceration, palmar surface. #2 sutures in place. No pain, erythema, drainage. Sutures removed.  Psychiatric: He has a normal mood and affect. His speech is normal and behavior is normal. Thought content normal.   BP 132/80 mmHg  Pulse 83  Temp(Src) 98.5 F (36.9 C) (Oral)  Resp 18  SpO2 97%  Assessment and Plan:  1. Hand laceration, left, subsequent encounter Sutures removed. Wound care discussed. Return as needed.   [place] Miners Dyke Brackett, MHS [facility]  05/17/2015

## 2015-08-19 ENCOUNTER — Ambulatory Visit (INDEPENDENT_AMBULATORY_CARE_PROVIDER_SITE_OTHER): Payer: BLUE CROSS/BLUE SHIELD | Admitting: Physician Assistant

## 2015-08-19 VITALS — BP 118/78 | HR 82 | Temp 98.0°F | Resp 12 | Ht 69.0 in | Wt 277.0 lb

## 2015-08-19 DIAGNOSIS — S90862A Insect bite (nonvenomous), left foot, initial encounter: Secondary | ICD-10-CM

## 2015-08-19 DIAGNOSIS — W57XXXA Bitten or stung by nonvenomous insect and other nonvenomous arthropods, initial encounter: Secondary | ICD-10-CM

## 2015-08-19 LAB — POCT CBC
Granulocyte percent: 49.3 %G (ref 37–80)
HCT, POC: 44 % (ref 43.5–53.7)
Hemoglobin: 15.3 g/dL (ref 14.1–18.1)
LYMPH, POC: 3.3 (ref 0.6–3.4)
MCH: 30 pg (ref 27–31.2)
MCHC: 34.8 g/dL (ref 31.8–35.4)
MCV: 86.3 fL (ref 80–97)
MID (CBC): 0.8 (ref 0–0.9)
MPV: 9.3 fL (ref 0–99.8)
POC Granulocyte: 3.9 (ref 2–6.9)
POC LYMPH PERCENT: 41.2 %L (ref 10–50)
POC MID %: 9.5 % (ref 0–12)
Platelet Count, POC: 193 10*3/uL (ref 142–424)
RBC: 5.1 M/uL (ref 4.69–6.13)
RDW, POC: 12.4 %
WBC: 7.9 10*3/uL (ref 4.6–10.2)

## 2015-08-19 NOTE — Patient Instructions (Addendum)
Take benadryl or zyrtec for allergic symptoms If not getting better in 48 hours or if at any time things get worse - i.e. Significant spreading of redness, pain, fever, chills, feeling sick --- let me know.    IF you received an x-ray today, you will receive an invoice from [facility]. Please contact [facility] at [phone] with questions or concerns regarding your invoice.   IF you received labwork today, you will receive an invoice from [facility]. Please contact [facility] at [phone] with questions or concerns regarding your invoice.   Our billing staff will not be able to assist you with questions regarding bills from these companies.  You will be contacted with the lab results as soon as they are available. The fastest way to get your results is to activate your My Chart account. Instructions are located on the last page of this paperwork. If you have not heard from us regarding the results in 2 weeks, please contact this office.

## 2015-08-19 NOTE — Progress Notes (Signed)
[facility] [address] [place] [address] [phone]  Date:  08/19/2015   Name:  Jonathan Russell   DOB:  31-Mar-1986   MRN:  [id number]  PCP:  No PCP Per Patient    Chief Complaint: Insect Bite   History of Present Illness:  This is a 30 y.o. male who is presenting with tick bite on left foot. 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 States he gets tick bites all the time. 2 days ago he found a small tick attached on his left foot in between his 1st and 2nd digits. He is certain it was only attached for a few hours. He removed head and body with tweezers. The following day he noticed some redness of the foot. Some itching. Mild pain near the site of the bite. Draining some clear fluid. He states he feels ok, no fevers or malaise. His wife had some left over amoxicillin and he has taken 2 doses so far.  Review of Systems:  Review of Systems See HPI  Patient Active Problem List   Diagnosis Date Noted  . Atrial fibrillation (HCC) 01/28/2013  . Nicotine addiction 09/20/2012  . BMI 37.0-37.9, adult 09/20/2012    Prior to Admission medications   Not on File    Allergies  Allergen Reactions  . Codeine Nausea Only    Past Surgical History  Procedure Laterality Date  . Hip surgery      Social History  Substance Use Topics  . Smoking status: Current Every Day Smoker -- 1.00 packs/day    Types: Cigarettes    Last Attempt to Quit: 11/08/2014  . Smokeless tobacco: None  . Alcohol Use: 0.0 oz/week    0 Standard drinks or equivalent per week    Family History  Problem Relation Age of Onset  . Dysrhythmia Mother     Medication list has been reviewed and updated.  Physical Examination:  Physical Exam  Constitutional: He is oriented to person, place, and time. He appears well-developed and well-nourished. No distress.  HENT:  Head: Normocephalic and atraumatic.  Right Ear: Hearing normal.  Left Ear:  Hearing normal.  Nose: Nose normal.  Eyes: Conjunctivae and lids are normal. Right eye exhibits no discharge. Left eye exhibits no discharge. No scleral icterus.  Pulmonary/Chest: Effort normal. No respiratory distress.  Musculoskeletal: Normal range of motion.       Feet:  Neurological: He is alert and oriented to person, place, and time.  Skin: Skin is warm and dry.  Bite site on left foot in between 1st and 2nd digits. Dried serous drainage surrounding. Mild erythema extending from bite to mid dorsal foot. No pain. Small amount induration around bite. Mild warmth. Full ROM ankle and foot without pain. Pedal pulses 2+ and symmetric.  Psychiatric: He has a normal mood and affect. His speech is normal and behavior is normal. Thought content normal.   BP 118/78 mmHg  Pulse 82  Temp(Src) 98 F (36.7 C) (Oral)  Resp 12  Ht  (1.753 m)  Wt 277 lb (125.646 kg)  BMI 40.89 kg/m2  SpO2 97%  Results for orders placed or performed in visit on 08/19/15  POCT CBC  Result Value Ref Range   WBC 7.9 4.6 - 10.2 K/uL   Lymph, poc 3.3 0.6 - 3.4   POC LYMPH PERCENT 41.2 10 - 50 %L   MID (cbc) 0.8 0 - 0.9   POC MID %  9.5 0 - 12 %M   POC Granulocyte 3.9 2 - 6.9   Granulocyte percent 49.3 37 - 80 %G   RBC 5.10 4.69 - 6.13 M/uL   Hemoglobin 15.3 14.1 - 18.1 g/dL   HCT, POC 16.144.0 09.643.5 - 53.7 %   MCV 86.3 80 - 97 fL   MCH, POC 30.0 27 - 31.2 pg   MCHC 34.8 31.8 - 35.4 g/dL   RDW, POC 04.512.4 %   Platelet Count, POC 193 142 - 424 K/uL   MPV 9.3 0 - 99.8 fL   Assessment and Plan:  1. Tick bite Erythema likely an inflammatory/allergic response to tick bite rather than infection. CBC wnl. Advised to use benadryl or zyrtec and apply ice. Demarcated the erythema with a marker. Advised if not getting better in 48 hours or if at any time symptoms worsen (return precautions discussed), he should return to clinic. - POCT CBC   Roswell MinersNicole V. Dyke BrackettBush, PA-C, MHS [facility]  08/23/2015

## 2015-08-23 ENCOUNTER — Encounter: Payer: Self-pay | Admitting: Physician Assistant

## 2016-02-15 ENCOUNTER — Ambulatory Visit (INDEPENDENT_AMBULATORY_CARE_PROVIDER_SITE_OTHER): Payer: BLUE CROSS/BLUE SHIELD | Admitting: Family Medicine

## 2016-02-15 ENCOUNTER — Ambulatory Visit (INDEPENDENT_AMBULATORY_CARE_PROVIDER_SITE_OTHER): Payer: BLUE CROSS/BLUE SHIELD

## 2016-02-15 VITALS — BP 112/90 | HR 80 | Temp 98.3°F | Resp 16 | Ht 69.0 in | Wt 268.0 lb

## 2016-02-15 DIAGNOSIS — T148XXA Other injury of unspecified body region, initial encounter: Secondary | ICD-10-CM | POA: Diagnosis not present

## 2016-02-15 MED ORDER — CEPHALEXIN 500 MG PO CAPS: 500.0000 mg | ORAL_CAPSULE | Freq: Four times a day (QID) | ORAL | 0 refills | Status: DC

## 2016-02-15 MED ORDER — MUPIROCIN 2 % EX OINT: 1.0000 "application " | TOPICAL_OINTMENT | Freq: Four times a day (QID) | CUTANEOUS | 0 refills | Status: DC

## 2016-02-15 NOTE — Progress Notes (Signed)
Subjective:  By signing my name below, I, Jonathan Russell, attest that this documentation has been prepared under the direction and in the presence of Norberto SorensonEva Lanier Felty, MD. Electronically Signed: Stann Oresung-Kai Russell, Scribe. 02/15/2016 , 8:34 AM .  Patient was seen in Room 6 .   Patient ID: Jonathan Russell Staff, male    DOB: 03/11/1986, 30 y.o.   MRN: [id number] Chief Complaint  Patient presents with  . Foot Injury    stepped on wire last night RT foot   HPI Jonathan Russell Botkins is a 30 y.o. male who [facility] of sudden onset foot injury which occurred last night. Patient states he was walking his dog last night. He walked over a piece of fencing and it went through his shoes. He felt a small prick when it happened. He's applied a bandaid over the affected area.   Immunization History  Administered Date(s) Administered  . Tdap 04/13/2012    History reviewed. No pertinent past medical history. Prior to Admission medications   Not on File   Allergies  Allergen Reactions  . Codeine Nausea Only   Review of Systems  Constitutional: Negative for fatigue and unexpected weight change.  Eyes: Negative for visual disturbance.  Respiratory: Negative for cough, chest tightness and shortness of breath.   Cardiovascular: Negative for chest pain, palpitations and leg swelling.  Gastrointestinal: Negative for abdominal pain and blood in stool.  Skin: Positive for wound.  Neurological: Negative for dizziness, light-headedness and headaches.       Objective:   Physical Exam  Constitutional: He is oriented to person, place, and time. He appears well-developed and well-nourished. No distress.  HENT:  Head: Normocephalic and atraumatic.  Eyes: EOM are normal. Pupils are equal, round, and reactive to light.  Neck: Neck supple.  Cardiovascular: Normal rate.   Pulmonary/Chest: Effort normal. No respiratory distress.  Musculoskeletal: Normal range of motion.  Neurological: He is alert and  oriented to person, place, and time.  Skin: Skin is warm and dry.  Small puncture bottom of right foot with some pus drainage   Psychiatric: He has a normal mood and affect. His behavior is normal.  Nursing note and vitals reviewed.   BP 112/90 (BP Location: Right Arm, Patient Position: Sitting, Cuff Size: Large)   Pulse 80   Temp 98.3 F (36.8 C) (Oral)   Resp 16   Ht 5\' 9"  (1.753 m)   Wt 268 lb (121.6 kg)   SpO2 97%   BMI 39.58 kg/m    Dg Foot 2 Views Right  Result Date: 02/15/2016 CLINICAL DATA:  Right foot injury. EXAM: RIGHT FOOT - 2 VIEW COMPARISON:  No recent prior. FINDINGS: No acute bony or joint abnormality identified. No evidence of fracture or dislocation. IMPRESSION: No acute abnormality. Electronically Signed   By: Maisie Fushomas  Register   On: 02/15/2016 08:50   Cleaned skin with EtoH. Anesthesia with 3cc subcu 2% lidocaine with epi under puncture wound. Wound explored and removed a tiny amount of black debris - rust or rubber. Wound cleansed with sterile water and dressed with mupirocin and pressure dressing.    Assessment & Plan:   1. Puncture wound   Does have some erythema around puncture and a line of erythema streaking medially to arch of foot so will cover with ora abx as well as topical. Warm soaks sev x/d, keep moist to drain for several days, keep covered when ambulating, clean with warm soapy water. RTC immed for any increased pain, redness,  swelling, or purulent drainage. Td UTD in 2013.  Orders Placed This Encounter  Procedures  . DG Foot 2 Views Right    Standing Status:   Future    Number of Occurrences:   1    Standing Expiration Date:   02/14/2017    Order Specific Question:   Reason for Exam (SYMPTOM  OR DIAGNOSIS REQUIRED)    Answer:   wire puncture into bottom of foot - looks like there is retained metal    Order Specific Question:   Preferred imaging location?    Answer:   External    Meds ordered this encounter  Medications  . cephALEXin  (KEFLEX) 500 MG capsule    Sig: Take 1 capsule (500 mg total) by mouth 4 (four) times daily.    Dispense:  20 capsule    Refill:  0  . mupirocin ointment (BACTROBAN) 2 %    Sig: Apply 1 application topically 4 (four) times daily.    Dispense:  30 g    Refill:  0    I personally performed the services described in this documentation, which was scribed in my presence. The recorded information has been reviewed and considered, and addended by me as needed.   Norberto Sorenson, M.D.  [facility] [address] [place], [place] [address] [phone] phone [phone] fax  02/15/16 5:31 PM

## 2016-02-15 NOTE — Patient Instructions (Addendum)
IF you received an x-ray today, you will receive an invoice from [facility]. Please contact [facility] at [phone] with questions or concerns regarding your invoice.   IF you received labwork today, you will receive an invoice from [employer]. Please contact [facility] at [phone] with questions or concerns regarding your invoice.   Our billing staff will not be able to assist you with questions regarding bills from these companies.  You will be contacted with the lab results as soon as they are available. The fastest way to get your results is to activate your My Chart account. Instructions are located on the last page of this paperwork. If you have not heard from [place] regarding the results in 2 weeks, please contact this office.      Puncture Wound A puncture wound is an injury that is caused by a sharp, thin object that goes through (penetrates) your skin. Usually, a puncture wound does not leave a large opening in your skin, so it may not bleed a lot. However, when you get a puncture wound, dirt or other materials (foreign bodies) can be forced into your wound and break off inside. This increases the chance of infection, such as tetanus. CAUSES Puncture wounds are caused by any sharp, thin object that goes through your skin, such as:  Animal teeth, as with an animal bite.  Sharp, pointed objects, such as nails, splinters of glass, fishhooks, and needles. SYMPTOMS Symptoms of a puncture wound include:  Pain.  Bleeding.  Swelling.  Bruising.  Fluid leaking from the wound.  Numbness, tingling, or loss of function. DIAGNOSIS This condition is diagnosed with a medical history and physical exam. Your wound will be checked to see if it contains any foreign bodies. You may also have X-rays or other imaging tests. TREATMENT Treatment for a puncture wound depends on how serious the wound is. It also depends on whether the wound  contains any foreign bodies. Treatment for all types of puncture wounds usually starts with:  Controlling the bleeding.  Washing out the wound with a germ-free (sterile) salt-water solution.  Checking the wound for foreign bodies. Treatment may also include:  Having the wound opened surgically to remove a foreign object.  Closing the wound with stitches (sutures) if it continues to bleed.  Covering the wound with antibiotic ointments and a bandage (dressing).  Receiving a tetanus shot.  Receiving a rabies vaccine. HOME CARE INSTRUCTIONS Medicines  Take or apply over-the-counter and prescription medicines only as told by your health care provider.  If you were prescribed an antibiotic, take or apply it as told by your health care provider. Do not stop using the antibiotic even if your condition improves. Wound Care  There are many ways to close and cover a wound. For example, a wound can be covered with sutures, skin glue, or adhesive strips. Follow instructions from your health care provider about:  How to take care of your wound.  When and how you should change your dressing.  When you should remove your dressing.  Removing whatever was used to close your wound.  Keep the dressing dry as told by your health care provider. Do not take baths, swim, use a hot tub, or do anything that would put your wound underwater until your health care provider approves.  Clean the wound as told by your health care provider.  Do not scratch or pick at the wound.  Check your wound every day for signs of  infection. Watch for:  Redness, swelling, or pain.  Fluid, blood, or pus. General Instructions  Raise (elevate) the injured area above the level of your heart while you are sitting or lying down.  If your puncture wound is in your foot, ask your health care provider if you need to avoid putting weight on your foot and for how long.  Keep all follow-up visits as told by your health  care provider. This is important. SEEK MEDICAL CARE IF:  You received a tetanus shot and you have swelling, severe pain, redness, or bleeding at the injection site.  You have a fever.  Your sutures come out.  You notice a bad smell coming from your wound or your dressing.  You notice something coming out of your wound, such as wood or glass.  Your pain is not controlled with medicine.  You have increased redness, swelling, or pain at the site of your wound.  You have fluid, blood, or pus coming from your wound.  You notice a change in the color of your skin near your wound.  You need to change the dressing frequently due to fluid, blood, or pus draining from your wound.  You develop a new rash.  You develop numbness around your wound. SEEK IMMEDIATE MEDICAL CARE IF:  You develop severe swelling around your wound.  Your pain suddenly increases and is severe.  You develop painful skin lumps.  You have a red streak going away from your wound.  The wound is on your hand or foot and you cannot properly move a finger or toe.  The wound is on your hand or foot and you notice that your fingers or toes look pale or bluish.   This information is not intended to replace advice given to you by your health care provider. Make sure you discuss any questions you have with your health care provider.   Document Released: 01/15/2005 Document Revised: 12/27/2014 Document Reviewed: 05/31/2014 Elsevier Interactive Patient Education 2016 Elsevier Inc.  Wound Care Taking care of your wound properly can help to prevent pain and infection. It can also help your wound to heal more quickly.  HOW TO CARE FOR YOUR WOUND  Take or apply over-the-counter and prescription medicines only as told by your health care provider.  If you were prescribed antibiotic medicine, take or apply it as told by your health care provider. Do not stop using the antibiotic even if your condition improves.  Clean  the wound each day or as told by your health care provider.  Wash the wound with mild soap and water.  Rinse the wound with water to remove all soap.  Pat the wound dry with a clean towel. Do not rub it.  There are many different ways to close and cover a wound. For example, a wound can be covered with stitches (sutures), skin glue, or adhesive strips. Follow instructions from your health care provider about:  How to take care of your wound.  When and how you should change your bandage (dressing).  When you should remove your dressing.  Removing whatever was used to close your wound.  Check your wound every day for signs of infection. Watch for:  Redness, swelling, or pain.  Fluid, blood, or pus.  Keep the dressing dry until your health care provider says it can be removed. Do not take baths, swim, use a hot tub, or do anything that would put your wound underwater until your health care provider approves.  Raise (elevate)  the injured area above the level of your heart while you are sitting or lying down.  Do not scratch or pick at the wound.  Keep all follow-up visits as told by your health care provider. This is important. SEEK MEDICAL CARE IF:  You received a tetanus shot and you have swelling, severe pain, redness, or bleeding at the injection site.  You have a fever.  Your pain is not controlled with medicine.  You have increased redness, swelling, or pain at the site of your wound.  You have fluid, blood, or pus coming from your wound.  You notice a bad smell coming from your wound or your dressing. SEEK IMMEDIATE MEDICAL CARE IF:  You have a red streak going away from your wound.   This information is not intended to replace advice given to you by your health care provider. Make sure you discuss any questions you have with your health care provider.   Document Released: 01/15/2008 Document Revised: 08/22/2014 Document Reviewed: 04/03/2014 Elsevier Interactive  Patient Education [employer] Elsevier Inc.

## 2016-11-08 ENCOUNTER — Ambulatory Visit (HOSPITAL_COMMUNITY)
Admission: EM | Admit: 2016-11-08 | Discharge: 2016-11-08 | Disposition: A | Discharge status: Home / Self Care | Payer: BLUE CROSS/BLUE SHIELD | Attending: Internal Medicine | Admitting: Internal Medicine

## 2016-11-08 ENCOUNTER — Encounter (HOSPITAL_COMMUNITY): Payer: Self-pay | Admitting: Emergency Medicine

## 2016-11-08 DIAGNOSIS — H5789 Other specified disorders of eye and adnexa: Secondary | ICD-10-CM

## 2016-11-08 DIAGNOSIS — H578 Other specified disorders of eye and adnexa: Secondary | ICD-10-CM

## 2016-11-08 MED ORDER — TOBRAMYCIN 0.3 % OP OINT
TOPICAL_OINTMENT | Freq: Two times a day (BID) | OPHTHALMIC | Status: DC
  Administered 2016-11-08: 17:00:00 via OPHTHALMIC

## 2016-11-08 MED ORDER — TETRACAINE HCL 0.5 % OP SOLN
OPHTHALMIC | Status: AC
  Filled 2016-11-08: qty 4

## 2016-11-08 MED ORDER — TOBRAMYCIN 0.3 % OP SOLN
OPHTHALMIC | Status: AC
  Filled 2016-11-08: qty 5
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
## 2016-11-08 NOTE — Discharge Instructions (Signed)
Follow up with opthalmology on Monday.
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
## 2019-01-07 ENCOUNTER — Ambulatory Visit (HOSPITAL_COMMUNITY)
Admission: EM | Admit: 2019-01-07 | Discharge: 2019-01-07 | Disposition: A | Discharge status: Home / Self Care | Payer: BC Managed Care – PPO | Source: Home / Self Care | Attending: Family Medicine | Admitting: Family Medicine

## 2019-01-07 ENCOUNTER — Encounter (HOSPITAL_COMMUNITY): Payer: Self-pay | Admitting: Emergency Medicine

## 2019-01-07 ENCOUNTER — Emergency Department (HOSPITAL_COMMUNITY)
Admission: EM | Admit: 2019-01-07 | Discharge: 2019-01-07 | Discharge status: Against Medical Advice | Payer: BC Managed Care – PPO | Attending: Emergency Medicine | Admitting: Emergency Medicine

## 2019-01-07 ENCOUNTER — Other Ambulatory Visit: Payer: Self-pay

## 2019-01-07 DIAGNOSIS — R1031 Right lower quadrant pain: Secondary | ICD-10-CM

## 2019-01-07 DIAGNOSIS — Z5329 Procedure and treatment not carried out because of patient's decision for other reasons: Secondary | ICD-10-CM

## 2019-01-07 DIAGNOSIS — G8929 Other chronic pain: Secondary | ICD-10-CM | POA: Diagnosis not present

## 2019-01-07 DIAGNOSIS — R1013 Epigastric pain: Secondary | ICD-10-CM

## 2019-01-07 DIAGNOSIS — F17210 Nicotine dependence, cigarettes, uncomplicated: Secondary | ICD-10-CM | POA: Diagnosis not present

## 2019-01-07 HISTORY — DX: Unspecified atrial fibrillation: I48.91

## 2019-01-07 LAB — CBC
HCT: 48.5 % (ref 39.0–52.0)
Hemoglobin: 15.4 g/dL (ref 13.0–17.0)
MCH: 29.4 pg (ref 26.0–34.0)
MCHC: 31.8 g/dL (ref 30.0–36.0)
MCV: 92.7 fL (ref 80.0–100.0)
Platelets: 244 10*3/uL (ref 150–400)
RBC: 5.23 MIL/uL (ref 4.22–5.81)
RDW: 12 % (ref 11.5–15.5)
WBC: 9.6 10*3/uL (ref 4.0–10.5)
nRBC: 0 % (ref 0.0–0.2)

## 2019-01-07 LAB — URINALYSIS, ROUTINE W REFLEX MICROSCOPIC
Bilirubin Urine: NEGATIVE
Glucose, UA: NEGATIVE mg/dL
Hgb urine dipstick: NEGATIVE
Ketones, ur: NEGATIVE mg/dL
Leukocytes,Ua: NEGATIVE
Nitrite: NEGATIVE
Protein, ur: NEGATIVE mg/dL
Specific Gravity, Urine: 1.019 (ref 1.005–1.030)
pH: 5 (ref 5.0–8.0)

## 2019-01-07 LAB — LIPASE, BLOOD: Lipase: 32 U/L (ref 11–51)

## 2019-01-07 LAB — COMPREHENSIVE METABOLIC PANEL
ALT: 21 U/L (ref 0–44)
AST: 19 U/L (ref 15–41)
Albumin: 4.4 g/dL (ref 3.5–5.0)
Alkaline Phosphatase: 75 U/L (ref 38–126)
Anion gap: 7 (ref 5–15)
BUN: 10 mg/dL (ref 6–20)
CO2: 25 mmol/L (ref 22–32)
Calcium: 9.3 mg/dL (ref 8.9–10.3)
Chloride: 105 mmol/L (ref 98–111)
Creatinine, Ser: 0.88 mg/dL (ref 0.61–1.24)
GFR calc Af Amer: >60 mL/min (ref >60)
GFR calc non Af Amer: >60 mL/min (ref >60)
Glucose, Bld: 96 mg/dL (ref 70–99)
Potassium: 4.2 mmol/L (ref 3.5–5.1)
Sodium: 137 mmol/L (ref 135–145)
Total Bilirubin: 0.7 mg/dL (ref 0.3–1.2)
Total Protein: 7.4 g/dL (ref 6.5–8.1)

## 2019-01-07 MED ORDER — SODIUM CHLORIDE 0.9% FLUSH: 3.0000 mL | Freq: Once | INTRAVENOUS | Status: DC

## 2019-01-07 NOTE — ED Triage Notes (Signed)
Pt arrives with reports of abd pain for 3 days. Endorses occasional diarrhea.

## 2019-01-07 NOTE — ED Triage Notes (Signed)
Pt reports abdominal pain in his upper, mid abdomen and in his RLQ and all along his right side.  Pt states the pain has been pretty bad for the last two days.  He denies any urinary issues or constipation, and no N, V, or D.  Pt denies fever.  He states the only time his abdomen won't hurt is if he is lying flat on his back.

## 2019-01-07 NOTE — ED Provider Notes (Signed)
[facility]    CSN: [id number] Arrival date & time: 01/07/19  1228      History   Chief Complaint Chief Complaint  Patient presents with  . Abdominal Pain    HPI Jonathan Sprynthony R Abdou is a 33 y.o. male.   Jonathan Russell presents with complaints of abdominal pain. Worsening over the past three days. Epigastric as well as RLQ and right flank. Burning to epigastric region and stabbing to RLQ. Worse with bending over and driving. Bumps in the road painful. Denies any previous similar. Nausea with heaves a few days ago. No further nausea or vomiting. Has been eating and drinking, this doesn't seem to worsen the symptoms. Normal BM's, last this morning. No blood in urine or other urinary symptoms. Pain 7/10. Has tried OTC stomach relief as well as maalox which haven't helped. No known fevers. History  Of afib.    ROS per HPI, negative if not otherwise mentioned.      Past Medical History:  Diagnosis Date  . Atrial fibrillation [facility](HCC)     Patient Active Problem List   Diagnosis Date Noted  . Atrial fibrillation (HCC) 01/28/2013  . Nicotine addiction 09/20/2012  . BMI 37.0-37.9, adult 09/20/2012    Past Surgical History:  Procedure Laterality Date  . HIP SURGERY         Home Medications    Prior to Admission medications   Medication Sig Start Date End Date Taking? Authorizing Provider  cephALEXin (KEFLEX) 500 MG capsule Take 1 capsule (500 mg total) by mouth 4 (four) times daily. 02/15/16   Sherren MochaShaw, Eva N, MD  mupirocin ointment (BACTROBAN) 2 % Apply 1 application topically 4 (four) times daily. 02/15/16   Sherren MochaShaw, Eva N, MD    Family History Family History  Problem Relation Age of Onset  . Dysrhythmia Mother     Social History Social History   Tobacco Use  . Smoking status: Current Some Day Smoker    Packs/day: 1.00    Types: Cigarettes    Last attempt to quit: 11/08/2014    Years since quitting: 4.1  . Smokeless tobacco: Never Used  Substance Use  Topics  . Alcohol use: Yes    Alcohol/week: 0.0 standard drinks  . Drug use: No     Allergies   Codeine   Review of Systems Review of Systems   Physical Exam Triage Vital Signs ED Triage Vitals  Enc Vitals Group     BP 01/07/19 1255 (!) 144/101     Pulse Rate 01/07/19 1255 75     Resp 01/07/19 1255 12     Temp 01/07/19 1255 (!) 97.5 F (36.4 C)     Temp Source 01/07/19 1255 Oral     SpO2 01/07/19 1255 97 %     Weight --      Height --      Head Circumference --      Peak Flow --      Pain Score 01/07/19 1252 7     Pain Loc --      Pain Edu? --      Excl. in GC? --    No data found.  Updated Vital Signs BP (!) 144/101 (BP Location: Right Arm)   Pulse 75   Temp (!) 97.5 F (36.4 C) (Oral)   Resp 12   SpO2 97%   Visual Acuity Right Eye Distance:   Left Eye Distance:   Bilateral Distance:    Right Eye Near:   Left  Eye Near:    Bilateral Near:     Physical Exam Constitutional:      Comments: Patient standing leaning against the exam table, in apparent discomfort; very slow to move to get onto exam table and with laying down   Cardiovascular:     Rate and Rhythm: Normal rate.  Pulmonary:     Effort: Pulmonary effort is normal.  Abdominal:     Palpations: Abdomen is soft.     Tenderness: There is abdominal tenderness in the right lower quadrant, epigastric area and periumbilical area. There is no rebound.     Comments: Pain is worse to epigastric region than to RLQ, but still with RLQ, somewhat mid abdomen pain as well as periumbilical pain; no pain with straight leg heel strike or with hop from exam table standing  Skin:    General: Skin is warm and dry.  Neurological:     Mental Status: He is alert and oriented to person, place, and time.      UC Treatments / Results  Labs (all labs ordered are listed, but only abnormal results are displayed) Labs Reviewed - No data to display  EKG   Radiology No results found.  Procedures Procedures  (including critical care time)  Medications Ordered in UC Medications - No data to display  Initial Impression / Assessment and Plan / UC Course  I have reviewed the triage vital signs and the nursing notes.  Pertinent labs & imaging results that were available during my care of the patient were reviewed by me and considered in my medical decision making (see chart for details).     Patient appears quite uncomfortable with worsening of abdominal pain. Non toxic- no fevers, no tachycardia. Epigastric pain on palpation is greatest, however also with periumbilical and right lower and flank pain on palpation. I feel more thorough evaluation is warranted to properly diagnosis source of pain. Reflux vs pud vs nephrolithiasis vs appendicitis considered. Patient agreeable and ambulatory out of clinic to go to the ER now.  Final Clinical Impressions(s) / UC Diagnoses   Final diagnoses:  Right lower quadrant abdominal pain  Epigastric pain     Discharge Instructions     I am concerned about the pain you are experiencing, particularly in the right lower quadrant, and do recommend going to the er for further evaluation of this.    ED Prescriptions    None     PDMP not reviewed this encounter.   Zigmund Gottron, NP 01/07/19 1331

## 2019-01-07 NOTE — ED Notes (Addendum)
Patient is refusing to provide vital signs. Patient is stating "You guys never do shit for me. This is ridiculous." Patient signed AMA. Patient A&O X4.

## 2019-01-07 NOTE — ED Provider Notes (Signed)
[facility] Provider Note   CSN: [id number] Arrival date & time: 01/07/19  1334     History   Chief Complaint Chief Complaint  Patient presents with  . Abdominal Pain    HPI Jonathan Russell R Shrode is a 33 y.o. male presents to the ER for evaluation of abdominal pain.  He was referred here from urgent care.  Reports "all over" constant, severe abdominal pain for at least 3 to 4 years.  States he just lives with it but in the last 2 to 3 days it has gotten significantly worse.  It is in a "J-shaped" from his epigastrium with radiation to the right mid, right lower abdomen, lateral right abdomen.  It is constant.  Described as burning, stabbing.  It is worse if he sitting or driving.  It is slightly improved if he lays down.  He was seen in urgent care earlier today and was told to come to the ER for evaluation.  He had CT scan abdomen and pelvis, ultrasound over the abdomen and saw gastroenterology in 2017.  They were not able to find the reason to his pain.  Gastroenterology recommended colonoscopy and EGD but patient declined.  States that 6 years ago his sister unexpectedly died during an EGD complication where a vessel was ruptured and she bled to death.  He states he is very scared of hospitals and has not wanted to do a colonoscopy or come back to the hospital due to this.  Unfortunately, his [facility] ER.  He is very nervous about being here.  He took some antiacid medicines that GI prescribed him these did not help.  No fever, chills, hematochezia, melena, dysuria, hematuria, urinary frequency.  No flank pain.  A couple of days ago he had some nausea and dry heaving but he did not vomit.  He had nonbloody non-melanotic diarrhea for couple of days that he thinks was related to something he ate but this morning his bowel movements normalized.  Occasional EtOH use but very rare.  No marijuana use.  Occasionally takes Excedrin but no other  NSAIDs.  No history of kidney stones.  Pain is not worse after eating.  There is no radiation into the chest.    HPI  Past Medical History:  Diagnosis Date  . Atrial fibrillation [facility](HCC)     Patient Active Problem List   Diagnosis Date Noted  . Atrial fibrillation (HCC) 01/28/2013  . Nicotine addiction 09/20/2012  . BMI 37.0-37.9, adult 09/20/2012    Past Surgical History:  Procedure Laterality Date  . HIP SURGERY          Home Medications    Prior to Admission medications   Medication Sig Start Date End Date Taking? Authorizing Provider  cephALEXin (KEFLEX) 500 MG capsule Take 1 capsule (500 mg total) by mouth 4 (four) times daily. 02/15/16   Sherren MochaShaw, Eva N, MD  mupirocin ointment (BACTROBAN) 2 % Apply 1 application topically 4 (four) times daily. 02/15/16   Sherren MochaShaw, Eva N, MD    Family History Family History  Problem Relation Age of Onset  . Dysrhythmia Mother     Social History Social History   Tobacco Use  . Smoking status: Current Some Day Smoker    Packs/day: 1.00    Types: Cigarettes    Last attempt to quit: 11/08/2014    Years since quitting: 4.1  . Smokeless tobacco: Never Used  Substance Use Topics  . Alcohol use: Yes  Alcohol/week: 0.0 standard drinks  . Drug use: No     Allergies   Codeine   Review of Systems Review of Systems  Gastrointestinal: Positive for abdominal pain, diarrhea (Resolved) and nausea (Resolved).  All other systems reviewed and are negative.    Physical Exam Updated Vital Signs BP (!) 142/98   Pulse 86   Temp 98 F (36.7 C) (Oral)   Ht 5\' 6"  (1.676 m)   Wt 88.5 kg   SpO2 100%   BMI 31.47 kg/m   Physical Exam Vitals signs and nursing note reviewed.  Constitutional:      Appearance: He is well-developed.     Comments: Withdrawn, poor eye contact.  HENT:     Head: Normocephalic and atraumatic.     Nose: Nose normal.  Eyes:     Conjunctiva/sclera: Conjunctivae normal.  Neck:     Musculoskeletal: Normal  range of motion.  Cardiovascular:     Rate and Rhythm: Normal rate and regular rhythm.     Heart sounds: Normal heart sounds.  Pulmonary:     Effort: Pulmonary effort is normal.     Breath sounds: Normal breath sounds.  Abdominal:     General: Bowel sounds are normal.     Palpations: Abdomen is soft.     Tenderness: There is abdominal tenderness in the right upper quadrant, epigastric area and periumbilical area.     Comments: Obese, soft abdomen.  Mild tenderness to epigastrium, right upper quadrant, right mid abdomen.  No G/R/R. No suprapubic or CVA tenderness. Negative Murphy's and McBurney's.  Active bowel sounds to lower quadrants.  Musculoskeletal: Normal range of motion.  Skin:    General: Skin is warm and dry.     Capillary Refill: Capillary refill takes less than 2 seconds.  Neurological:     Mental Status: He is alert.  Psychiatric:        Behavior: Behavior normal.      ED Treatments / Results  Labs (all labs ordered are listed, but only abnormal results are displayed) Labs Reviewed  LIPASE, BLOOD  COMPREHENSIVE METABOLIC PANEL  CBC  URINALYSIS, ROUTINE W REFLEX MICROSCOPIC    EKG None  Radiology No results found.  Procedures Procedures (including critical care time)  Medications Ordered in ED Medications  sodium chloride flush (NS) 0.9 % injection 3 mL (has no administration in time range)     Initial Impression / Assessment and Plan / ED Course  I have reviewed the triage vital signs and the nursing notes.  Pertinent labs & imaging results that were available during my care of the patient were reviewed by me and considered in my medical decision making (see chart for details).  Clinical Course as of Jan 06 1913  Fri Jan 07, 2019  1717 Scheduled for colonoscopy, did the prep and was afraidto do it due to sister    [CG]    Clinical Course User Index [CG] Kinnie Feil, PA-C   I have reviewed patient's EMR to obtain pertinent PMH.  I  reviewed gastroenterology notes in 2017.  He was evaluated for right-sided abdominal pain, occasional constipation.  At that time he had ultrasound and CTs that did not reveal etiology.  Mild fatty liver infiltration noted but otherwise imaging at that time was negative.  Gastroenterology documented patient declined EGD due to complications/death of his sister during an EGD and fear.  They scheduled a colonoscopy but patient states that the last minute he got too scared and canceled.  He  is still not interested in an EGD or colonoscopy.  Exam reveals diffuse right-sided upper abdominal pain.  No RLQ, suprapubic, right CVA tenderness.  Afebrile.  Active bowel sounds.  He has no distal neuro pulse deficits, chest pain.  Given chronicity of symptoms, previous thorough evaluation by GI it is unlikely that there is acute intra-abdominal or pelvic process however given that he has not had any imaging the last several years, I recommended ultrasound of his right upper quadrant as it seems that his right upper abdomen is most tender.  He has no right lower quadrant or McBurney's point tenderness.  Afebrile.  Lab work reviewed by me is vastly normal without leukocytosis.  His LFTs are normal.  Lipase normal.  I recommended RUQ ultrasound, pain medicines here.  Discussed with patient that given chronicity of symptoms we may not determine the exact cause to his pain and likely will need GI follow-up.  States he "just want something for this pain".  I offered pain medicines here but discussed we would not be prescribing narcotic pain medicines if he were to be discharged and would recommend Bentyl, Tylenol, simethicone and bowel regimen with close GI follow-up.  Patient immediately stood up and started swearing, states that he came here to find answers and get rid of this pain.  I explained to patient that we were still in the process of working him up and would not be making decisions about discharge or admission or  treatment until ultrasound returned.  RN at bedside during conversation.  Patient stood up and requested his paperwork.  Discussed the risk of leaving AGAINST MEDICAL ADVICE without any imaging to rule out cholecystitis, appendicitis, kidney source.  Patient cut me off and stated "I do not care".  He stood up from chair and ambulated in the room without obvious discomfort.  Patient left AMA.   Final Clinical Impressions(s) / ED Diagnoses   Final diagnoses:  Chronic abdominal pain  Left against medical advice    ED Discharge Orders    None       Jerrell Mylar 01/07/19 1914    Wynetta Fines, MD 01/08/19 [phone]

## 2019-01-07 NOTE — Discharge Instructions (Signed)
I am concerned about the pain you are experiencing, particularly in the right lower quadrant, and do recommend going to the er for further evaluation of this.

## 2019-02-22 ENCOUNTER — Encounter: Payer: Self-pay | Admitting: Emergency Medicine

## 2019-02-22 ENCOUNTER — Other Ambulatory Visit: Payer: Self-pay

## 2019-02-22 ENCOUNTER — Ambulatory Visit
Admission: EM | Admit: 2019-02-22 | Discharge: 2019-02-22 | Disposition: A | Discharge status: Home / Self Care | Payer: BC Managed Care – PPO | Attending: Emergency Medicine | Admitting: Emergency Medicine

## 2019-02-22 DIAGNOSIS — R21 Rash and other nonspecific skin eruption: Secondary | ICD-10-CM | POA: Diagnosis not present

## 2019-02-22 DIAGNOSIS — L304 Erythema intertrigo: Secondary | ICD-10-CM

## 2019-02-22 MED ORDER — KETOCONAZOLE 2 % EX CREA: 1.0000 "application " | TOPICAL_CREAM | Freq: Every day | CUTANEOUS | 0 refills | Status: DC

## 2019-02-22 MED ORDER — TRIAMCINOLONE ACETONIDE 0.1 % EX CREA: 1.0000 "application " | TOPICAL_CREAM | Freq: Two times a day (BID) | CUTANEOUS | 0 refills | Status: DC

## 2019-02-22 MED ORDER — MICONAZOLE POWD: 1.0000 "application " | Freq: Every day | 0 refills | Status: DC

## 2019-02-22 NOTE — ED Triage Notes (Signed)
Pt presents to [facility] for assessment of rash to groin and inner thighs since July.  States it started out as a "heat rash", which he gets often.  But powdering it seemed to make it worse.  Has tried OTC topical antibiotic, as well as baby rash medications, and other creams, without relief.

## 2019-02-22 NOTE — Discharge Instructions (Signed)
Use triamcinolone and ketoconazole once daily. Use miconazole powder to keep area dry. Avoid hot showers, pat area dry. Try to avoid itching, prolonged moisture exposure. Return for worsening pain, discharge, testicular swelling, fever.

## 2019-02-22 NOTE — ED Provider Notes (Signed)
[facility]    CSN: [id number] Arrival date & time: 02/22/19  0806      History   Chief Complaint Chief Complaint  Patient presents with  . Rash    HPI Jonathan Russell is a 33 y.o. male presenting for chronic groin rash.  States this started in July when "it was hot and humid out" the put off seeking evaluation second to concerns about Covid exposure.  Patient has tried OTC powders, topical antibiotics, baby rash medications without significant relief.  Past Medical History:  Diagnosis Date  . Atrial fibrillation [facility])     Patient Active Problem List   Diagnosis Date Noted  . Atrial fibrillation ([place]) 01/28/2013  . Nicotine addiction 09/20/2012  . BMI 37.0-37.9, adult 09/20/2012    Past Surgical History:  Procedure Laterality Date  . HIP SURGERY         Home Medications    Prior to Admission medications   Medication Sig Start Date End Date Taking? Authorizing Provider  ketoconazole (NIZORAL) 2 % cream Apply 1 application topically daily. 02/22/19   Hall-Potvin, [place], PA-C  Miconazole POWD 1 application by Does not apply route daily. 02/22/19   Hall-Potvin, [place], PA-C  triamcinolone cream (KENALOG) 0.1 % Apply 1 application topically 2 (two) times daily. 02/22/19   Hall-Potvin, [place], PA-C    Family History Family History  Problem Relation Age of Onset  . Dysrhythmia Mother   . Diabetes Mother   . Diabetes Father     Social History Social History   Tobacco Use  . Smoking status: Former Smoker    Packs/day: 1.00    Types: Cigarettes    Quit date: 11/08/2014    Years since quitting: 4.2  . Smokeless tobacco: Never Used  Substance Use Topics  . Alcohol use: Yes    Alcohol/week: 0.0 standard drinks  . Drug use: No     Allergies   Codeine   Review of Systems Review of Systems  Constitutional: Negative for fatigue and fever.  Respiratory: Negative for cough and shortness of breath.   Cardiovascular: Negative for chest  pain and palpitations.  Gastrointestinal: Negative for abdominal pain, diarrhea and vomiting.  Genitourinary: Negative for difficulty urinating, discharge, frequency, penile pain, penile swelling, scrotal swelling, testicular pain and urgency.  Musculoskeletal: Negative for arthralgias and myalgias.  Skin: Positive for rash. Negative for wound.  Neurological: Negative for speech difficulty and headaches.  All other systems reviewed and are negative.    Physical Exam Triage Vital Signs ED Triage Vitals  Enc Vitals Group     BP      Pulse      Resp      Temp      Temp src      SpO2      Weight      Height      Head Circumference      Peak Flow      Pain Score      Pain Loc      Pain Edu?      Excl. in [place]?    No data found.  Updated Vital Signs BP 136/84 (BP Location: Left Arm)   Pulse 66   Temp (!) 97.4 F (36.3 C) (Temporal)   Resp 18   SpO2 96%   Visual Acuity Right Eye Distance:   Left Eye Distance:   Bilateral Distance:    Right Eye Near:   Left Eye Near:    Bilateral Near:  Physical Exam Constitutional:      General: He is not in acute distress. HENT:     Head: Normocephalic and atraumatic.  Eyes:     General: No scleral icterus.    Pupils: Pupils are equal, round, and reactive to light.  Cardiovascular:     Rate and Rhythm: Normal rate.  Pulmonary:     Effort: Pulmonary effort is normal. No respiratory distress.     Breath sounds: No wheezing.  Skin:    Coloration: Skin is not cyanotic, jaundiced or mottled.     Findings: Erythema and rash present. Rash is macular and scaling.       Neurological:     Mental Status: He is alert and oriented to person, place, and time.      UC Treatments / Results  Labs (all labs ordered are listed, but only abnormal results are displayed) Labs Reviewed - No data to display  EKG   Radiology No results found.  Procedures Procedures (including critical care time)  Medications Ordered in UC  Medications - No data to display  Initial Impression / Assessment and Plan / UC Course  I have reviewed the triage vital signs and the nursing notes.  Pertinent labs & imaging results that were available during my care of the patient were reviewed by me and considered in my medical decision making (see chart for details).     Worsening chronic rash consistent with intertrigo.  Will treat with antifungals, low-dose steroids as outlined below.  Return precautions discussed, patient verbalized understanding and is agreeable to plan. Final Clinical Impressions(s) / UC Diagnoses   Final diagnoses:  Intertrigo     Discharge Instructions     Use triamcinolone and ketoconazole once daily. Use miconazole powder to keep area dry. Avoid hot showers, pat area dry. Try to avoid itching, prolonged moisture exposure. Return for worsening pain, discharge, testicular swelling, fever.    ED Prescriptions    Medication Sig Dispense Auth. Provider   triamcinolone cream (KENALOG) 0.1 % Apply 1 application topically 2 (two) times daily. 30 g Hall-Potvin, [place], PA-C   ketoconazole (NIZORAL) 2 % cream Apply 1 application topically daily. 15 g Hall-Potvin, [place], PA-C   Miconazole POWD 1 application by Does not apply route daily. 100 g Hall-Potvin, [place], PA-C     PDMP not reviewed this encounter.   Hall-Potvin, [place], [place] 02/22/19 1217

## 2019-02-22 NOTE — ED Notes (Signed)
Patient able to ambulate independently  

## 2019-02-27 ENCOUNTER — Telehealth: Payer: Self-pay | Admitting: Emergency Medicine

## 2019-02-27 NOTE — Telephone Encounter (Signed)
PT reports rash has not improved and is warm and red. Asks for antibiotics over the phone. Jonathan Russell, [place] saw patient 11/3 and requests that he come back to be seen for reevaluation of rash prior to prescribing additional meds. PT acknowledges request and does not confirm that he will return to be seen.

## 2021-03-25 ENCOUNTER — Other Ambulatory Visit: Payer: Self-pay

## 2021-03-25 ENCOUNTER — Ambulatory Visit
Admission: EM | Admit: 2021-03-25 | Discharge: 2021-03-25 | Disposition: A | Discharge status: Home / Self Care | Payer: Self-pay | Attending: Physician Assistant | Admitting: Physician Assistant

## 2021-03-25 DIAGNOSIS — R519 Headache, unspecified: Secondary | ICD-10-CM

## 2021-03-25 MED ORDER — CYCLOBENZAPRINE HCL 10 MG PO TABS: 10.0000 mg | ORAL_TABLET | Freq: Two times a day (BID) | ORAL | 0 refills | Status: DC | PRN

## 2021-03-25 MED ORDER — PREDNISONE 20 MG PO TABS: 40.0000 mg | ORAL_TABLET | Freq: Every day | ORAL | 0 refills | Status: AC

## 2021-03-25 NOTE — ED Provider Notes (Signed)
[facility]    CSN: [id number] Arrival date & time: 03/25/21  1300      History   Chief Complaint Chief Complaint  Patient presents with   Head Pain    HPI Jonathan Russell is a 35 y.o. male.   Patient here today for evaluation of occipital head pressure that is been present for the last few days.  He reports that when he bends his head over or when he rotates his head from the left to the right he will have worsening pain.  He denies any nausea or vomiting.  He has not had any changes in vision.  He states he woke with pain and initially thought that he slept wrong.  He has tried over-the-counter ibuprofen without significant relief.  The history is provided by the patient.   Past Medical History:  Diagnosis Date   Atrial fibrillation [facility])     Patient Active Problem List   Diagnosis Date Noted   Atrial fibrillation ([place]) 01/28/2013   Nicotine addiction 09/20/2012   BMI 37.0-37.9, adult 09/20/2012    Past Surgical History:  Procedure Laterality Date   HIP SURGERY         Home Medications    Prior to Admission medications   Medication Sig Start Date End Date Taking? Authorizing Provider  cyclobenzaprine (FLEXERIL) 10 MG tablet Take 1 tablet (10 mg total) by mouth 2 (two) times daily as needed for muscle spasms. 03/25/21  Yes Francene Finders, PA-C  predniSONE (DELTASONE) 20 MG tablet Take 2 tablets (40 mg total) by mouth daily with breakfast for 5 days. 03/25/21 03/30/21 Yes Francene Finders, PA-C  ketoconazole (NIZORAL) 2 % cream Apply 1 application topically daily. 02/22/19   Hall-Potvin, [place], PA-C  Miconazole POWD 1 application by Does not apply route daily. 02/22/19   Hall-Potvin, [place], PA-C  triamcinolone cream (KENALOG) 0.1 % Apply 1 application topically 2 (two) times daily. 02/22/19   Hall-Potvin, [place], PA-C    Family History Family History  Problem Relation Age of Onset   Dysrhythmia Mother    Diabetes Mother    Diabetes  Father     Social History Social History   Tobacco Use   Smoking status: Former    Packs/day: 1.00    Types: Cigarettes    Quit date: 11/08/2014    Years since quitting: 6.3   Smokeless tobacco: Never  Vaping Use   Vaping Use: Never used  Substance Use Topics   Alcohol use: Yes    Alcohol/week: 0.0 standard drinks   Drug use: No     Allergies   Codeine   Review of Systems Review of Systems  Constitutional:  Negative for chills and fever.  Eyes:  Negative for discharge, redness and visual disturbance.  Respiratory:  Negative for shortness of breath.   Gastrointestinal:  Negative for nausea and vomiting.  Skin:  Positive for color change and wound.  Neurological:  Positive for headaches. Negative for numbness.    Physical Exam Triage Vital Signs ED Triage Vitals  Enc Vitals Group     BP 03/25/21 1411 (!) 135/95     Pulse Rate 03/25/21 1411 80     Resp 03/25/21 1411 18     Temp 03/25/21 1411 (!) 97.5 F (36.4 C)     Temp Source 03/25/21 1411 Oral     SpO2 03/25/21 1411 94 %     Weight 03/25/21 1409 265 lb (120.2 kg)     Height 03/25/21 1409 5\' 10"  (  1.778 m)     Head Circumference --      Peak Flow --      Pain Score 03/25/21 1408 9     Pain Loc --      Pain Edu? --      Excl. in [place]? --    No data found.  Updated Vital Signs BP (!) 135/95 (BP Location: Left Arm)   Pulse 80   Temp (!) 97.5 F (36.4 C) (Oral)   Resp 18   Ht 5\' 10"  (1.778 m)   Wt 265 lb (120.2 kg)   SpO2 94%   BMI 38.02 kg/m       Physical Exam Vitals and nursing note reviewed.  Constitutional:      General: He is not in acute distress.    Appearance: Normal appearance. He is not ill-appearing.  HENT:     Head: Normocephalic and atraumatic.  Eyes:     Extraocular Movements: Extraocular movements intact.     Conjunctiva/sclera: Conjunctivae normal.     Right eye: Right conjunctiva is not injected. No exudate.    Left eye: Left conjunctiva is not injected. No exudate.     Pupils: Pupils are equal, round, and reactive to light.  Cardiovascular:     Rate and Rhythm: Normal rate.  Pulmonary:     Effort: Pulmonary effort is normal.  Neurological:     Mental Status: He is alert.  Psychiatric:        Mood and Affect: Mood normal.        Behavior: Behavior normal.        Thought Content: Thought content normal.     UC Treatments / Results  Labs (all labs ordered are listed, but only abnormal results are displayed) Labs Reviewed - No data to display  EKG   Radiology No results found.  Procedures Procedures (including critical care time)  Medications Ordered in UC Medications - No data to display  Initial Impression / Assessment and Plan / UC Course  I have reviewed the triage vital signs and the nursing notes.  Pertinent labs & imaging results that were available during my care of the patient were reviewed by me and considered in my medical decision making (see chart for details).  Suspect likely muscular cause of pain given worsened with movement.  Recommended Flexeril as well as prednisone for treatment.  Discussed follow-up in the emergency room if no improvement over the next 48 hours or sooner with any worsening.  Final Clinical Impressions(s) / UC Diagnoses   Final diagnoses:  Occipital headache   Discharge Instructions   None    ED Prescriptions     Medication Sig Dispense Auth. Provider   predniSONE (DELTASONE) 20 MG tablet Take 2 tablets (40 mg total) by mouth daily with breakfast for 5 days. 10 tablet F, PA-C   cyclobenzaprine (FLEXERIL) 10 MG tablet Take 1 tablet (10 mg total) by mouth 2 (two) times daily as needed for muscle spasms. 20 tablet M, PA-C      PDMP not reviewed this encounter.   Tomi Bamberger, PA-C 03/25/21 1447

## 2021-03-25 NOTE — ED Triage Notes (Signed)
Pt c/o head pressure. Pt states that it does not feel like a headache. Pt states that pain gets worse when he bends over or turns his head. Pain is in the back of the head. Pain started on 03/22/21

## 2021-05-13 ENCOUNTER — Other Ambulatory Visit: Payer: Self-pay

## 2021-05-13 ENCOUNTER — Encounter (HOSPITAL_COMMUNITY): Payer: Self-pay | Admitting: *Deleted

## 2021-05-13 ENCOUNTER — Ambulatory Visit (HOSPITAL_COMMUNITY)
Admission: EM | Admit: 2021-05-13 | Discharge: 2021-05-13 | Disposition: A | Discharge status: Home / Self Care | Payer: Self-pay | Attending: Family Medicine | Admitting: Family Medicine

## 2021-05-13 ENCOUNTER — Ambulatory Visit (INDEPENDENT_AMBULATORY_CARE_PROVIDER_SITE_OTHER): Payer: Self-pay

## 2021-05-13 DIAGNOSIS — R079 Chest pain, unspecified: Secondary | ICD-10-CM

## 2021-05-13 DIAGNOSIS — R0789 Other chest pain: Secondary | ICD-10-CM

## 2021-05-13 DIAGNOSIS — R5383 Other fatigue: Secondary | ICD-10-CM

## 2021-05-13 LAB — COMPREHENSIVE METABOLIC PANEL
ALT: 24 U/L (ref 0–44)
AST: 21 U/L (ref 15–41)
Albumin: 4.4 g/dL (ref 3.5–5.0)
Alkaline Phosphatase: 59 U/L (ref 38–126)
Anion gap: 10 (ref 5–15)
BUN: 12 mg/dL (ref 6–20)
CO2: 28 mmol/L (ref 22–32)
Calcium: 10 mg/dL (ref 8.9–10.3)
Chloride: 100 mmol/L (ref 98–111)
Creatinine, Ser: 0.92 mg/dL (ref 0.61–1.24)
GFR, Estimated: >60 mL/min (ref >60)
Glucose, Bld: 107 mg/dL — ABNORMAL HIGH (ref 70–99)
Potassium: 4.4 mmol/L (ref 3.5–5.1)
Sodium: 138 mmol/L (ref 135–145)
Total Bilirubin: 0.5 mg/dL (ref 0.3–1.2)
Total Protein: 7.3 g/dL (ref 6.5–8.1)

## 2021-05-13 LAB — CBC WITH DIFFERENTIAL/PLATELET
Abs Immature Granulocytes: 0.04 10*3/uL (ref 0.00–0.07)
Basophils Absolute: 0 10*3/uL (ref 0.0–0.1)
Basophils Relative: 1 %
Eosinophils Absolute: 0.4 10*3/uL (ref 0.0–0.5)
Eosinophils Relative: 6 %
HCT: 45.7 % (ref 39.0–52.0)
Hemoglobin: 15.4 g/dL (ref 13.0–17.0)
Immature Granulocytes: 1 %
Lymphocytes Relative: 36 %
Lymphs Abs: 2.8 10*3/uL (ref 0.7–4.0)
MCH: 29.9 pg (ref 26.0–34.0)
MCHC: 33.7 g/dL (ref 30.0–36.0)
MCV: 88.7 fL (ref 80.0–100.0)
Monocytes Absolute: 0.6 10*3/uL (ref 0.1–1.0)
Monocytes Relative: 7 %
Neutro Abs: 3.9 10*3/uL (ref 1.7–7.7)
Neutrophils Relative %: 49 %
Platelets: 257 10*3/uL (ref 150–400)
RBC: 5.15 MIL/uL (ref 4.22–5.81)
RDW: 11.7 % (ref 11.5–15.5)
WBC: 7.8 10*3/uL (ref 4.0–10.5)
nRBC: 0 % (ref 0.0–0.2)

## 2021-05-13 LAB — TSH: TSH: 1.757 u[IU]/mL (ref 0.350–4.500)

## 2021-05-13 NOTE — ED Provider Notes (Signed)
[facility]    CSN: [id number] Arrival date & time: 05/13/21  6195      History   Chief Complaint Chief Complaint  Patient presents with   Chills   Fatigue   Nausea    HPI Jonathan Russell is a 36 y.o. male.   HPI Here for a 2-week history of feeling dizzy or lightheaded some.  He also has had some nausea that is intermittent.  He has felt tired despite sleeping well last evening he has some tightness in his chest.  It does not seem that he is feeling pressure like something sitting on him.  No fever or chills noted to me.  No upper respiratory symptoms in this timeframe. Past history is significant for some intermittent atrial fibrillation that he has not had to undergo daily treatment for.  He states that he knows when he has atrial fibrillation as he has palpitations.  He has not had any palpitations in this last 2 weeks.  He did have a slight weight gain since Christmas  Past Medical History:  Diagnosis Date   Atrial fibrillation [facility])     Patient Active Problem List   Diagnosis Date Noted   Atrial fibrillation (HCC) 01/28/2013   Nicotine addiction 09/20/2012   BMI 37.0-37.9, adult 09/20/2012    Past Surgical History:  Procedure Laterality Date   HIP SURGERY         Home Medications    Prior to Admission medications   Not on File    Family History Family History  Problem Relation Age of Onset   Dysrhythmia Mother    Diabetes Mother    Diabetes Father     Social History Social History   Tobacco Use   Smoking status: Former    Packs/day: 1.00    Types: Cigarettes    Quit date: 11/08/2014    Years since quitting: 6.5   Smokeless tobacco: Never  Vaping Use   Vaping Use: Never used  Substance Use Topics   Alcohol use: Yes    Alcohol/week: 0.0 standard drinks   Drug use: No     Allergies   Codeine   Review of Systems Review of Systems   Physical Exam Triage Vital Signs ED Triage Vitals  Enc Vitals Group     BP  05/13/21 0949 (!) 139/93     Pulse Rate 05/13/21 0949 86     Resp 05/13/21 0949 18     Temp 05/13/21 0949 98.6 F (37 C)     Temp src --      SpO2 05/13/21 0949 96 %     Weight --      Height --      Head Circumference --      Peak Flow --      Pain Score 05/13/21 0947 6     Pain Loc --      Pain Edu? --      Excl. in GC? --    No data found.  Updated Vital Signs BP (!) 139/93    Pulse 86    Temp 98.6 F (37 C)    Resp 18    SpO2 96%   Visual Acuity Right Eye Distance:   Left Eye Distance:   Bilateral Distance:    Right Eye Near:   Left Eye Near:    Bilateral Near:     Physical Exam Vitals reviewed.  Constitutional:      General: He is not in acute distress.  Appearance: He is not toxic-appearing or diaphoretic.  HENT:     Right Ear: Tympanic membrane normal.     Left Ear: Tympanic membrane normal.     Nose: Nose normal.     Mouth/Throat:     Mouth: Mucous membranes are moist.     Pharynx: No oropharyngeal exudate or posterior oropharyngeal erythema.  Eyes:     Extraocular Movements: Extraocular movements intact.     Conjunctiva/sclera: Conjunctivae normal.     Pupils: Pupils are equal, round, and reactive to light.  Cardiovascular:     Rate and Rhythm: Normal rate and regular rhythm.     Heart sounds: No murmur heard. Pulmonary:     Effort: Pulmonary effort is normal.     Breath sounds: Normal breath sounds. No stridor. No wheezing, rhonchi or rales.  Musculoskeletal:     Cervical back: Neck supple.  Lymphadenopathy:     Cervical: No cervical adenopathy.  Skin:    Capillary Refill: Capillary refill takes less than 2 seconds.     Coloration: Skin is not jaundiced or pale.  Neurological:     General: No focal deficit present.     Mental Status: He is alert and oriented to person, place, and time.  Psychiatric:        Behavior: Behavior normal.     UC Treatments / Results  Labs (all labs ordered are listed, but only abnormal results are  displayed) Labs Reviewed - No data to display  EKG   Radiology DG Chest 2 View  Result Date: 05/13/2021 CLINICAL DATA:  Congestion, chest pain and tightness. Former smoker. EXAM: CHEST - 2 VIEW COMPARISON:  12/25/2012. FINDINGS: Trachea is midline. Heart size normal. Lungs are clear. No pleural fluid. IMPRESSION: No acute findings. Electronically Signed   By: Leanna Battles M.D.   On: 05/13/2021 10:28    Procedures Procedures (including critical care time)  Medications Ordered in UC Medications - No data to display  Initial Impression / Assessment and Plan / UC Course  I have reviewed the triage vital signs and the nursing notes.  Pertinent labs & imaging results that were available during my care of the patient were reviewed by me and considered in my medical decision making (see chart for details).     CXR clear. EKG shows no ST segment elevation or depression; sinus rhythm. Will do some general lab (cbc, cmp, and TSH) to address the fatigue, and request assistance to find him a pcp Final Clinical Impressions(s) / UC Diagnoses   Final diagnoses:  Fatigue, unspecified type  Chest tightness     Discharge Instructions      Your chest x-ray was clear.  EKG did not show any atrial fibrillation at this point.  Also it did not show any signs of heart attack.  Blood is drawn today for a CBC which we will check for anemia.  Also we will do blood work for electrolytes like potassium and sodium, and for sugar.  We will also do a basic thyroid function check     ED Prescriptions   None    PDMP not reviewed this encounter.   Zenia Resides, MD 05/13/21 1039

## 2021-05-13 NOTE — Discharge Instructions (Addendum)
Your chest x-ray was clear.  EKG did not show any atrial fibrillation at this point.  Also it did not show any signs of heart attack.  Blood is drawn today for a CBC which we will check for anemia.  Also we will do blood work for electrolytes like potassium and sodium, and for sugar.  We will also do a basic thyroid function check

## 2021-05-13 NOTE — ED Triage Notes (Signed)
Pt reports for several weeks he has felt chills and felt hot. Pt reports he feels fatigue and nausea.  Pt reports he feels like he has a knot in his chest.

## 2022-08-29 IMAGING — DX DG CHEST 2V
2 series · 2 of 2 positions shown · non-contrast
Comparison: 12/25/2012.

CLINICAL DATA: Congestion, chest pain and tightness. Former smoker.

EXAM:
CHEST - 2 VIEW

[chest pa]
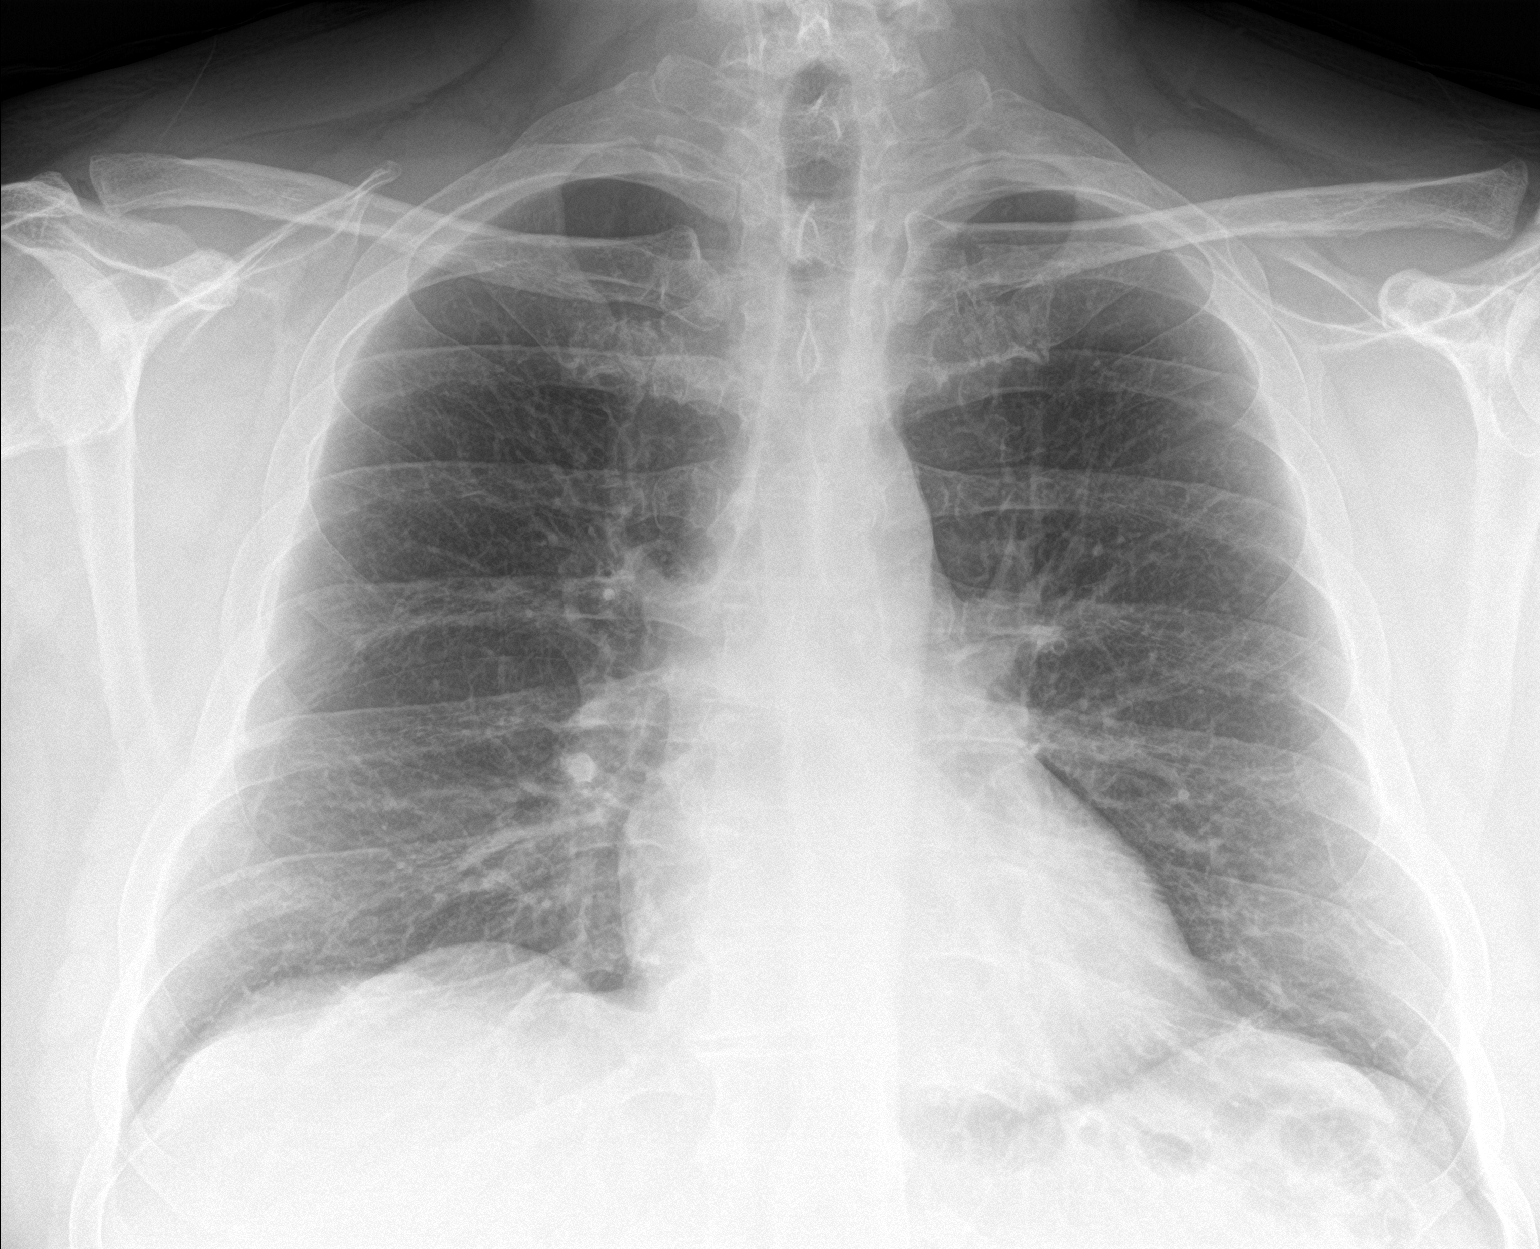

[chest lat]
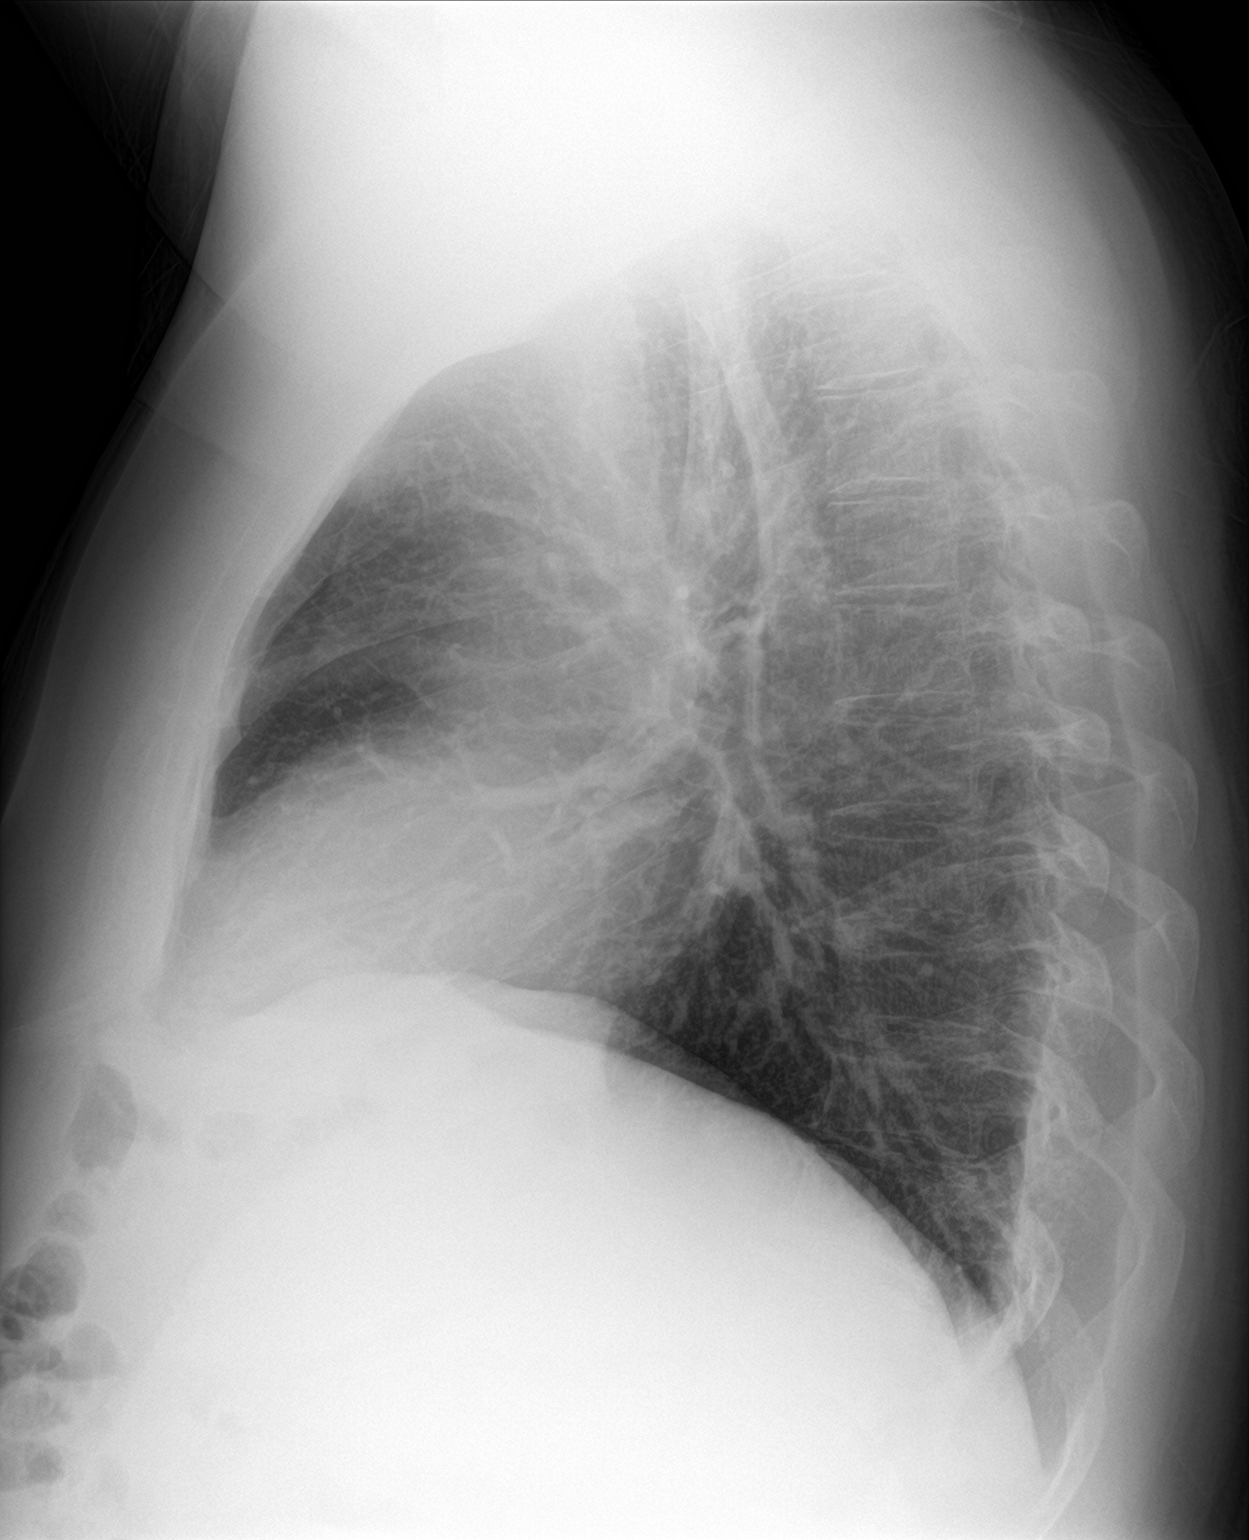

[2 of 2 positions shown; findings below may reference images not displayed]

FINDINGS: Trachea is midline. Heart size normal. Lungs are clear. No pleural
fluid.
IMPRESSION: No acute findings.

## 2022-12-01 ENCOUNTER — Other Ambulatory Visit: Payer: Self-pay
